# Patient Record
Sex: Female | Born: 2000 | Race: Black or African American | Hispanic: No | Marital: Single | Smoking: Never smoker
Health system: Southern US, Community
[De-identification: ages and names within clinical notes are randomized; demographics above are authoritative.]

## PROBLEM LIST (undated history)

## (undated) DIAGNOSIS — F419 Anxiety disorder, unspecified: Secondary | ICD-10-CM

## (undated) DIAGNOSIS — R011 Cardiac murmur, unspecified: Secondary | ICD-10-CM

## (undated) DIAGNOSIS — E119 Type 2 diabetes mellitus without complications: Secondary | ICD-10-CM

## (undated) HISTORY — PX: ABCESS DRAINAGE: SHX399

## (undated) HISTORY — PX: EYE MUSCLE SURGERY: SHX370

---
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
## 2020-10-30 ENCOUNTER — Other Ambulatory Visit: Payer: Self-pay

## 2020-10-30 ENCOUNTER — Emergency Department (HOSPITAL_COMMUNITY)
Admission: EM | Admit: 2020-10-30 | Discharge: 2020-10-30 | Disposition: A | Discharge status: Home / Self Care | Payer: Medicaid Other | Attending: Emergency Medicine | Admitting: Emergency Medicine

## 2020-10-30 ENCOUNTER — Encounter (HOSPITAL_COMMUNITY): Payer: Self-pay | Admitting: Emergency Medicine

## 2020-10-30 DIAGNOSIS — Z7984 Long term (current) use of oral hypoglycemic drugs: Secondary | ICD-10-CM | POA: Insufficient documentation

## 2020-10-30 DIAGNOSIS — A5901 Trichomonal vulvovaginitis: Secondary | ICD-10-CM | POA: Insufficient documentation

## 2020-10-30 DIAGNOSIS — R21 Rash and other nonspecific skin eruption: Secondary | ICD-10-CM | POA: Insufficient documentation

## 2020-10-30 DIAGNOSIS — R739 Hyperglycemia, unspecified: Secondary | ICD-10-CM

## 2020-10-30 DIAGNOSIS — Z794 Long term (current) use of insulin: Secondary | ICD-10-CM | POA: Diagnosis not present

## 2020-10-30 DIAGNOSIS — E1165 Type 2 diabetes mellitus with hyperglycemia: Secondary | ICD-10-CM | POA: Insufficient documentation

## 2020-10-30 DIAGNOSIS — Z7722 Contact with and (suspected) exposure to environmental tobacco smoke (acute) (chronic): Secondary | ICD-10-CM | POA: Diagnosis not present

## 2020-10-30 LAB — CBC WITH DIFFERENTIAL/PLATELET
Abs Immature Granulocytes: 0.02 10*3/uL (ref 0.00–0.07)
Basophils Absolute: 0 10*3/uL (ref 0.0–0.1)
Basophils Relative: 0 %
Eosinophils Absolute: 0.1 10*3/uL (ref 0.0–0.5)
Eosinophils Relative: 1 %
HCT: 35 % — ABNORMAL LOW (ref 36.0–46.0)
Hemoglobin: 11.3 g/dL — ABNORMAL LOW (ref 12.0–15.0)
Immature Granulocytes: 0 %
Lymphocytes Relative: 38 %
Lymphs Abs: 2.1 10*3/uL (ref 0.7–4.0)
MCH: 27.2 pg (ref 26.0–34.0)
MCHC: 32.3 g/dL (ref 30.0–36.0)
MCV: 84.1 fL (ref 80.0–100.0)
Monocytes Absolute: 0.7 10*3/uL (ref 0.1–1.0)
Monocytes Relative: 12 %
Neutro Abs: 2.7 10*3/uL (ref 1.7–7.7)
Neutrophils Relative %: 49 %
Platelets: 244 10*3/uL (ref 150–400)
RBC: 4.16 MIL/uL (ref 3.87–5.11)
RDW: 13.1 % (ref 11.5–15.5)
WBC: 5.5 10*3/uL (ref 4.0–10.5)
nRBC: 0 % (ref 0.0–0.2)

## 2020-10-30 LAB — I-STAT BETA HCG BLOOD, ED (MC, WL, AP ONLY): I-stat hCG, quantitative: <5 m[IU]/mL (ref <5)

## 2020-10-30 LAB — COMPREHENSIVE METABOLIC PANEL
ALT: 15 U/L (ref 0–44)
AST: 14 U/L — ABNORMAL LOW (ref 15–41)
Albumin: 3.3 g/dL — ABNORMAL LOW (ref 3.5–5.0)
Alkaline Phosphatase: 95 U/L (ref 38–126)
Anion gap: 8 (ref 5–15)
BUN: 7 mg/dL (ref 6–20)
CO2: 22 mmol/L (ref 22–32)
Calcium: 8.7 mg/dL — ABNORMAL LOW (ref 8.9–10.3)
Chloride: 104 mmol/L (ref 98–111)
Creatinine, Ser: 0.51 mg/dL (ref 0.44–1.00)
GFR, Estimated: >60 mL/min (ref >60)
Glucose, Bld: 344 mg/dL — ABNORMAL HIGH (ref 70–99)
Potassium: 3.7 mmol/L (ref 3.5–5.1)
Sodium: 134 mmol/L — ABNORMAL LOW (ref 135–145)
Total Bilirubin: 0.4 mg/dL (ref 0.3–1.2)
Total Protein: 7 g/dL (ref 6.5–8.1)

## 2020-10-30 LAB — CBG MONITORING, ED: Glucose-Capillary: 369 mg/dL — ABNORMAL HIGH (ref 70–99)

## 2020-10-30 LAB — WET PREP, GENITAL
Clue Cells Wet Prep HPF POC: NONE SEEN
Sperm: NONE SEEN
Yeast Wet Prep HPF POC: NONE SEEN

## 2020-10-30 LAB — HIV ANTIBODY (ROUTINE TESTING W REFLEX): HIV Screen 4th Generation wRfx: NONREACTIVE

## 2020-10-30 MED ORDER — PENICILLIN G BENZATHINE 1200000 UNIT/2ML IM SUSY
2.4000 10*6.[IU] | PREFILLED_SYRINGE | Freq: Once | INTRAMUSCULAR | Status: AC
  Administered 2020-10-30: 2.4 10*6.[IU] via INTRAMUSCULAR
  Filled 2020-10-30: qty 4

## 2020-10-30 MED ORDER — METRONIDAZOLE 500 MG PO TABS
500.0000 mg | ORAL_TABLET | Freq: Once | ORAL | Status: AC
  Administered 2020-10-30: 500 mg via ORAL
  Filled 2020-10-30: qty 1

## 2020-10-30 MED ORDER — METRONIDAZOLE 500 MG PO TABS: 500.0000 mg | ORAL_TABLET | Freq: Two times a day (BID) | ORAL | 0 refills | Status: DC

## 2020-10-30 NOTE — Discharge Instructions (Addendum)
Please read the instructions below.  Talk with your primary care provider about any new medications.  Please schedule an appointment for follow up with your OBGYN or primary care.  Finish your antibiotic (Flagyl/Metronidazole) as prescribed. Do not drink alcohol with this medication as it will cause vomiting.  You have been treated with penicillin today. This will treat syphilis, as we discussed your rash is concerning for this.  You will receive a call from the hospital if your test results come back positive. Avoid all sexual activity until you know your test results. If your results come back positive, it is important that you inform all of your sexual partners.  Return to the ER for high fever, severe abdominal pain, or new or worsening symptoms.

## 2020-10-30 NOTE — ED Provider Notes (Signed)
Emergency Medicine Provider Triage Evaluation Note  Danielle Norman , a 20 y.o. female  was evaluated in triage.  Pt complains of rash on hands, started in groin, now spreading. Onset 2 weeks ago.  Review of Systems  Positive: Rash, itching, vaginal dc Negative: Fever  Physical Exam  BP (!) 131/57 (BP Location: Right Arm)   Pulse 86   Temp 99 F (37.2 C) (Oral)   Resp 18   SpO2 100%  Gen:   Awake, no distress   Resp:  Normal effort  MSK:   Moves extremities without difficulty  Other:  Rash to hands visible in triage  Medical Decision Making  Medically screening exam initiated at 12:34 PM.  Appropriate orders placed.  Danielle Norman was informed that the remainder of the evaluation will be completed by another provider, this initial triage assessment does not replace that evaluation, and the importance of remaining in the ED until their evaluation is complete.     Jeannie Fend, PA-C 10/30/20 1235    Tilden Fossa, MD 10/31/20 [phone]

## 2020-10-30 NOTE — ED Provider Notes (Signed)
[facility]   CSN: [id number] Arrival date & time: 10/30/20  1224     History No chief complaint on file.   Danielle Norman is a 20 y.o. female past medical history of type 2 diabetes, presenting for evaluation of rash and vaginal discharge. Patient states rash initially developed into her inner thighs and groin about 2 months ago.  Has been slowly spreading to her hands and feet, mostly on the palms and soles.  She also has rash to her upper abdomen underneath her breasts.  Rash to her hands and feet feel sore.  Initially was more red in color, now is brown.  She states vaginal discharge began around the same time rash started.  Initially thought it was a yeast infection because she frequents that.  Endorses is associated body aches and fatigue.  No fevers.  She states she is sexually active with female partners, has had an occurrence without protection.  No new medications.  No sick contacts.  The history is provided by the patient.       Past Medical History:  Diagnosis Date  . Anxiety   . Diabetes mellitus, type 2 (HCC)    Diagnosed 10/2014, hbA1c 14.1% at diagnosis.  Started on insulin and metformin  . Heart murmur     Patient Active Problem List   Diagnosis Date Noted  . Elevated hemoglobin A1c 04/26/2018  . Peripheral neuropathy 04/26/2018  . Abscess 04/05/2018  . Fever   . Gluteal abscess   . Pilonidal cyst with abscess 04/04/2018  . Severe obesity due to excess calories with serious comorbidity and body mass index (BMI) greater than 99th percentile for age in pediatric patient (HCC) 08/16/2017  . 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 Maladaptive health behaviors affecting medical condition 11/02/2016  . Morbid obesity (HCC) 08/03/2016  . Acanthosis nigricans 08/03/2016  . Non compliance w medication regimen   . Dehydration   . Ketonuria   . Hyperglycemia 10/30/2014  . Type 2 diabetes mellitus with  hyperglycemia, with long-term current use of insulin (HCC) 10/30/2014  . Anxiety     Past Surgical History:  Procedure Laterality Date  . EYE MUSCLE SURGERY       OB History    Gravida  0   Para  0   Term  0   Preterm  0   AB  0   Living  0     SAB  0   IAB  0   Ectopic  0   Multiple  0   Live Births              Family History  Problem Relation Age of Onset  . Kidney disease Father   . Diabetes Father        Reported as type 1 diabetes  . Diabetes Maternal Aunt        type 2 diabetes, was on oral meds, now diet controlled  . Thyroid disease Maternal Grandmother     Social History   Tobacco Use  . Smoking status: Passive Smoke Exposure - Never Smoker  . Smokeless tobacco: Never Used  Vaping Use  . Vaping Use: Former  . Start date: 04/06/2015  . Quit date: 12/04/2017  Substance Use Topics  . Alcohol use: No  . Drug use: Yes    Types: Marijuana    Comment: pt states she smokes once a month    Home Medications Prior to Admission medications  Medication Sig Start Date End Date Taking? Authorizing Provider  metroNIDAZOLE (FLAGYL) 500 MG tablet Take 1 tablet (500 mg total) by mouth 2 (two) times daily. 10/30/20  Yes Roxan Hockey, [place] N, PA-C  Accu-Chek FastClix Lancets MISC Check sugar 6 x daily 10/21/18   Gretchen Short, NP  ACCU-CHEK GUIDE test strip CHECK BLOOD SUGAR UP TO 6 TIMES PER DAY 04/18/18   Gretchen Short, NP  acetaminophen (TYLENOL) 325 MG tablet Take 2 tablets (650 mg total) by mouth every 6 (six) hours as needed for mild pain or moderate pain. 04/06/18   Collene Gobble I, MD  acetone, urine, test strip Check ketones per protocol 11/01/14   Mittie Bodo, MD  glucagon 1 MG injection Use for Severe Hypoglycemia . Inject 1 mg intramuscularly if unresponsive, unable to swallow, unconscious and/or has seizure Patient taking differently: Inject 1 mg into the muscle once as needed. Use for Severe Hypoglycemia . Inject 1 mg intramuscularly  if unresponsive, unable to swallow, unconscious and/or has seizure 11/01/14   Mittie Bodo, MD  insulin aspart (NOVOLOG FLEXPEN) 100 UNIT/ML FlexPen INJECT UP TO 75 UNITS DAILY AS DIRECTED BY MD Patient taking differently: Inject 15-25 Units into the skin 3 (three) times daily with meals. Sliding scale 06/14/17   Gretchen Short, NP  insulin degludec (TRESIBA) 100 UNIT/ML SOPN FlexTouch Pen Inject up to 50 units into the skin daily 09/22/18   Gretchen Short, NP  Insulin Pen Needle (BD PEN NEEDLE NANO U/F) 32G X 4 MM MISC BD PEN NEEDLES- BRAND SPECIFIC. INJECT INSULIN VIA INSULIN PEN 6 X DAILY 10/21/18   Gretchen Short, NP  metFORMIN (GLUCOPHAGE XR) 500 MG 24 hr tablet Take 3 tablets (1,500 mg total) by mouth daily with breakfast. 05/12/17   Gretchen Short, NP  norgestimate-ethinyl estradiol (ORTHO-CYCLEN,SPRINTEC,PREVIFEM) 0.25-35 MG-MCG tablet Take 1 tablet by mouth daily. 02/18/18   Rolm Bookbinder, CNM    Allergies    Patient has no known allergies.  Review of Systems   Review of Systems  Constitutional: Positive for fatigue.  Genitourinary: Positive for vaginal discharge.  Musculoskeletal: Positive for myalgias (Generalized).  Skin: Positive for rash.  All other systems reviewed and are negative.   Physical Exam Updated Vital Signs BP 139/74   Pulse 82   Temp 98.3 F (36.8 C) (Oral)   Resp 18   SpO2 100%   Physical Exam Vitals and nursing note reviewed. Exam conducted with a chaperone present.  Constitutional:      General: She is not in acute distress.    Appearance: She is well-developed. She is not ill-appearing.  HENT:     Head: Normocephalic and atraumatic.  Eyes:     Conjunctiva/sclera: Conjunctivae normal.  Cardiovascular:     Rate and Rhythm: Normal rate and regular rhythm.  Pulmonary:     Effort: Pulmonary effort is normal. No respiratory distress.     Breath sounds: Normal breath sounds.  Abdominal:     General: Bowel sounds are normal.      Palpations: Abdomen is soft.     Tenderness: There is no abdominal tenderness.  Genitourinary:    Cervix: Normal.     Uterus: Normal.      Adnexa: Right adnexa normal and left adnexa normal.     Comments: Pelvic exam performed with female nurse tech chaperone present. Dark red blood present (patient menstruating) Skin:    General: Skin is warm.     Comments: Hands and feet, more commonly to the palms and soles with  hyperpigmented macules with some scaling/dry center.  Similar rash noted to the anterior torso inferior to the breasts, as well as medial proximal thighs  Neurological:     Mental Status: She is alert.  Psychiatric:        Behavior: Behavior normal.         ED Results / Procedures / Treatments   Labs (all labs ordered are listed, but only abnormal results are displayed) Labs Reviewed  WET PREP, GENITAL - Abnormal; Notable for the following components:      Result Value   Trich, Wet Prep PRESENT (*)    WBC, Wet Prep HPF POC MANY (*)    All other components within normal limits  COMPREHENSIVE METABOLIC PANEL - Abnormal; Notable for the following components:   Sodium 134 (*)    Glucose, Bld 344 (*)    Calcium 8.7 (*)    Albumin 3.3 (*)    AST 14 (*)    All other components within normal limits  CBC WITH DIFFERENTIAL/PLATELET - Abnormal; Notable for the following components:   Hemoglobin 11.3 (*)    HCT 35.0 (*)    All other components within normal limits  CBG MONITORING, ED - Abnormal; Notable for the following components:   Glucose-Capillary 369 (*)    All other components within normal limits  HIV ANTIBODY (ROUTINE TESTING W REFLEX)  RPR  I-STAT BETA HCG BLOOD, ED ([facility], [facility], [facility] ONLY)  GC/CHLAMYDIA PROBE AMP ([facility]) NOT AT [facility]    EKG None  Radiology No results found.  Procedures Procedures   Medications Ordered in ED Medications  penicillin g benzathine (BICILLIN LA) 1200000 UNIT/2ML injection 2.4 Million Units (2.4 Million Units  Intramuscular Given 10/30/20 1851)  metroNIDAZOLE (FLAGYL) tablet 500 mg (500 mg Oral Given 10/30/20 1852)    ED Course  I have reviewed the triage vital signs and the nursing notes.  Pertinent labs & imaging results that were available during my care of the patient were reviewed by me and considered in my medical decision making (see chart for details).    MDM Rules/Calculators/A&P                          Patient is a 20 year old sexually active female presenting for 2 months of persisting rash to her hands, feet, torso and medial thighs.  Also complains of vaginal discharge.  She has associated generalized body aches and fatigue, no fever.  Pelvic exam without significant tenderness, she is also menstruating.  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  She has not had any new medications, new insect or tick bites, no exposures to hand-foot-and-mouth (duration of rash also seems extended for normal course).   Wet prep positive for trichomonas.  HIV antibody is nonreactive.  RPR is still pending.  Blood work is unremarkable the exception of hyperglycemia.  Will cover with Bicillin for syphilis.  She is also treated with Flagyl for trichomonas.Pt has been advised to not drink alcohol while on this medication.  Exam not concerning for PID because pt is hemodynamically stable, no cervical motion tenderness on pelvic exam  Patient to be discharged with instructions to follow up with OBGYN. Discussed importance of using protection when sexually active. Pt understands that they have STD cultures pending and that they will need to inform all sexual partners if results return positive.  Pt afebrile and nontoxic, safe for discharge home. Strict return precautions discussed.  Discussed patient with Dr. Denton Lank,  who agrees with workup and care plan.   Final Clinical Impression(s) / ED Diagnoses Final diagnoses:  Hyperglycemia  Rash and nonspecific skin eruption  Trichomonas  vaginitis    Rx / DC Orders ED Discharge Orders         Ordered    metroNIDAZOLE (FLAGYL) 500 MG tablet  2 times daily        10/30/20 1857           Porter Nakama, [place] N, PA-C 10/30/20 1857    Cathren Laine, MD 10/30/20 2251

## 2020-10-30 NOTE — ED Triage Notes (Signed)
Pt here with c/o vaginal discharge and rash to her hands and inner thighs been ongoing for 2 months

## 2020-10-31 LAB — RPR
RPR Ser Ql: REACTIVE — AB
RPR Titer: 1:16 {titer}

## 2020-10-31 LAB — GC/CHLAMYDIA PROBE AMP (~~LOC~~) NOT AT ARMC
Chlamydia: POSITIVE — AB
Comment: NEGATIVE
Comment: NORMAL
Neisseria Gonorrhea: NEGATIVE

## 2020-11-02 LAB — T.PALLIDUM AB, TOTAL: T Pallidum Abs: REACTIVE — AB

## 2021-04-06 ENCOUNTER — Emergency Department (HOSPITAL_COMMUNITY): Payer: Medicaid Other

## 2021-04-06 ENCOUNTER — Emergency Department (HOSPITAL_COMMUNITY)
Admission: EM | Admit: 2021-04-06 | Discharge: 2021-04-06 | Disposition: A | Discharge status: Home / Self Care | Payer: Medicaid Other | Attending: Emergency Medicine | Admitting: Emergency Medicine

## 2021-04-06 ENCOUNTER — Other Ambulatory Visit: Payer: Self-pay

## 2021-04-06 ENCOUNTER — Encounter (HOSPITAL_COMMUNITY): Payer: Self-pay | Admitting: Emergency Medicine

## 2021-04-06 DIAGNOSIS — N9489 Other specified conditions associated with female genital organs and menstrual cycle: Secondary | ICD-10-CM | POA: Diagnosis not present

## 2021-04-06 DIAGNOSIS — E119 Type 2 diabetes mellitus without complications: Secondary | ICD-10-CM | POA: Diagnosis not present

## 2021-04-06 DIAGNOSIS — Z7984 Long term (current) use of oral hypoglycemic drugs: Secondary | ICD-10-CM | POA: Diagnosis not present

## 2021-04-06 DIAGNOSIS — Z794 Long term (current) use of insulin: Secondary | ICD-10-CM | POA: Diagnosis not present

## 2021-04-06 DIAGNOSIS — Z87891 Personal history of nicotine dependence: Secondary | ICD-10-CM | POA: Diagnosis not present

## 2021-04-06 DIAGNOSIS — L0231 Cutaneous abscess of buttock: Secondary | ICD-10-CM | POA: Insufficient documentation

## 2021-04-06 DIAGNOSIS — L0291 Cutaneous abscess, unspecified: Secondary | ICD-10-CM

## 2021-04-06 LAB — CBC WITH DIFFERENTIAL/PLATELET
Abs Immature Granulocytes: 0.04 10*3/uL (ref 0.00–0.07)
Basophils Absolute: 0 10*3/uL (ref 0.0–0.1)
Basophils Relative: 0 %
Eosinophils Absolute: 0 10*3/uL (ref 0.0–0.5)
Eosinophils Relative: 0 %
HCT: 41.3 % (ref 36.0–46.0)
Hemoglobin: 13.5 g/dL (ref 12.0–15.0)
Immature Granulocytes: 0 %
Lymphocytes Relative: 34 %
Lymphs Abs: 3.7 10*3/uL (ref 0.7–4.0)
MCH: 28.1 pg (ref 26.0–34.0)
MCHC: 32.7 g/dL (ref 30.0–36.0)
MCV: 86 fL (ref 80.0–100.0)
Monocytes Absolute: 0.8 10*3/uL (ref 0.1–1.0)
Monocytes Relative: 7 %
Neutro Abs: 6.2 10*3/uL (ref 1.7–7.7)
Neutrophils Relative %: 59 %
Platelets: 340 10*3/uL (ref 150–400)
RBC: 4.8 MIL/uL (ref 3.87–5.11)
RDW: 12.1 % (ref 11.5–15.5)
WBC: 10.8 10*3/uL — ABNORMAL HIGH (ref 4.0–10.5)
nRBC: 0 % (ref 0.0–0.2)

## 2021-04-06 LAB — COMPREHENSIVE METABOLIC PANEL
ALT: 18 U/L (ref 0–44)
AST: 14 U/L — ABNORMAL LOW (ref 15–41)
Albumin: 3.9 g/dL (ref 3.5–5.0)
Alkaline Phosphatase: 126 U/L (ref 38–126)
Anion gap: 8 (ref 5–15)
BUN: 8 mg/dL (ref 6–20)
CO2: 27 mmol/L (ref 22–32)
Calcium: 9.8 mg/dL (ref 8.9–10.3)
Chloride: 98 mmol/L (ref 98–111)
Creatinine, Ser: 0.64 mg/dL (ref 0.44–1.00)
GFR, Estimated: >60 mL/min (ref >60)
Glucose, Bld: 368 mg/dL — ABNORMAL HIGH (ref 70–99)
Potassium: 3.7 mmol/L (ref 3.5–5.1)
Sodium: 133 mmol/L — ABNORMAL LOW (ref 135–145)
Total Bilirubin: 0.4 mg/dL (ref 0.3–1.2)
Total Protein: 8 g/dL (ref 6.5–8.1)

## 2021-04-06 LAB — I-STAT BETA HCG BLOOD, ED (MC, WL, AP ONLY): I-stat hCG, quantitative: <5 m[IU]/mL (ref <5)

## 2021-04-06 MED ORDER — HYDROCODONE-ACETAMINOPHEN 5-325 MG PO TABS: 1.0000 | ORAL_TABLET | Freq: Four times a day (QID) | ORAL | 0 refills | Status: DC | PRN

## 2021-04-06 MED ORDER — LIDOCAINE-EPINEPHRINE (PF) 2 %-1:200000 IJ SOLN
10.0000 mL | Freq: Once | INTRAMUSCULAR | Status: AC
  Administered 2021-04-06: 10 mL
  Filled 2021-04-06: qty 20

## 2021-04-06 MED ORDER — DOXYCYCLINE HYCLATE 100 MG PO CAPS: 100.0000 mg | ORAL_CAPSULE | Freq: Two times a day (BID) | ORAL | 0 refills | Status: AC

## 2021-04-06 MED ORDER — IOHEXOL 350 MG/ML SOLN
75.0000 mL | Freq: Once | INTRAVENOUS | Status: AC | PRN
  Administered 2021-04-06: 75 mL via INTRAVENOUS

## 2021-04-06 MED ORDER — DOXYCYCLINE HYCLATE 100 MG PO TABS
100.0000 mg | ORAL_TABLET | Freq: Once | ORAL | Status: AC
  Administered 2021-04-06: 100 mg via ORAL
  Filled 2021-04-06: qty 1

## 2021-04-06 NOTE — ED Notes (Signed)
DC instructions reviewed with pt. Pt verbalized understanding.  Pt DC 

## 2021-04-06 NOTE — ED Notes (Signed)
Pt transported to CT scanner.

## 2021-04-06 NOTE — ED Provider Notes (Signed)
Emergency Medicine Provider Triage Evaluation Note  Danielle Norman , a 20 y.o. female  was evaluated in triage.  Pt complains of gluteal abscess.  Patient has an abscess to the superior inner right buttock without drainage.  Patient reports she has had an abscess similar to this before that is needed surgical drainage.  Denies any recorded fevers, but mentions chills.  Denies any changes to urinary or bowel habits.  Review of Systems  Positive: Abscess, chills Negative: Fevers, dysuria, hematuria, diarrhea, constipation  Physical Exam  BP (!) 141/82   Pulse 93   Temp 98.3 F (36.8 C) (Oral)   Resp 16   LMP 03/23/2021   SpO2 98%  Gen:   Awake, no distress   Resp:  Normal effort  MSK:   Moves extremities without difficulty  Other:  Small area of induration to the anterior medial right gluteal cleft.  No active drainage.  No erythema or warmth to the area  Medical Decision Making  Medically screening exam initiated at 1:07 PM.  Appropriate orders placed.  LADAWNA WALGREN was informed that the remainder of the evaluation will be completed by another provider, this initial triage assessment does not replace that evaluation, and the importance of remaining in the ED until their evaluation is complete.     Achille Rich, PA-C 04/06/21 1308    Jacalyn Lefevre, MD 04/06/21 1420

## 2021-04-06 NOTE — ED Triage Notes (Addendum)
Pt reports rectal abscess since yesterday with fatigue.  Denies fever and chills.

## 2021-04-06 NOTE — Discharge Instructions (Addendum)
I am prescribing you doxycycline.  This is a strong antibiotic you need to take twice a day for the next 7 days.  Do not stop taking this antibiotic early.  I have prescribed you a strong narcotic called vicodin. Please only take this as prescribed. This medication also has tylenol in it, so please be sure you are not taking more than 3000 mg of tylenol per day. Do not drive or operate heavy machinery after taking this medication. Do not mix it with alcohol.   Please continue to apply warm compresses as well as perform hot soaks.  This will help the abscess drain.  Please have your wound rechecked in 2 days.  If you develop any new or worsening symptoms please come back to the emergency department immediately.  It was a pleasure to meet you.

## 2021-04-06 NOTE — ED Provider Notes (Signed)
[facility] EMERGENCY DEPARTMENT Provider Note   CSN: [id number] Arrival date & time: 04/06/21  1157     History No chief complaint on file.   Danielle Norman is a 20 y.o. female.  HPI Patient is a 20 year old female who presents to the emergency department due to an abscess.  Patient states her symptoms started about 2 days ago and have been progressively worsening.  States her pain and swelling is along the superior inner region of the right buttock.  Patient notes chills and fatigue but denies any fevers, nausea, vomiting, diarrhea.  No abdominal pain.  No drainage.  Patient reports a history of abscess in the region requiring surgical intervention.    Past Medical History:  Diagnosis Date   Anxiety    Diabetes mellitus, type 2 (HCC)    Diagnosed 10/2014, hbA1c 14.1% at diagnosis.  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/2017   Maladaptive health behaviors affecting medical condition 11/02/2016   Morbid obesity (HCC) 08/03/2016   Acanthosis nigricans 08/03/2016   Non compliance w medication regimen    Dehydration    Ketonuria    Hyperglycemia 10/30/2014   Type 2 diabetes mellitus with hyperglycemia, with long-term current use of insulin (HCC) 10/30/2014   Anxiety     Past Surgical History:  Procedure Laterality Date   EYE MUSCLE SURGERY       OB History     Gravida  0   Para  0   Term  0   Preterm  0   AB  0   Living  0      SAB  0   IAB  0   Ectopic  0   Multiple  0   Live Births               Family History  Problem Relation Age of Onset   Kidney disease Father    Diabetes Father        Reported as type 1 diabetes   Diabetes Maternal Aunt        type 2 diabetes, was on oral meds, now diet controlled   Thyroid disease Maternal Grandmother     Social History   Tobacco Use   Smoking status: Passive Smoke Exposure - Never Smoker   Smokeless tobacco: Never  Vaping Use   Vaping Use: Former   Start date: 04/06/2015   Quit date: 12/04/2017  Substance Use Topics   Alcohol use: No   Drug use: Yes    Types: Marijuana    Comment: pt states she smokes once a month    Home Medications Prior to Admission medications   Medication Sig Start Date End Date Taking? Authorizing Provider  doxycycline (VIBRAMYCIN) 100 MG capsule Take 1 capsule (100 mg total) by mouth 2 (two) times daily for 7 days. 04/06/21 04/13/21 Yes Placido Sou, PA-C  HYDROcodone-acetaminophen (NORCO/VICODIN) 5-325 MG tablet Take 1 tablet by mouth every 6 (six) hours as needed. 04/06/21  Yes Placido Sou, PA-C  Accu-Chek FastClix Lancets MISC Check  sugar 6 x daily 10/21/18   Gretchen Short, NP  ACCU-CHEK GUIDE test strip CHECK BLOOD SUGAR UP TO 6 TIMES PER DAY 04/18/18   Gretchen Short, NP  acetone, urine, test strip Check ketones per protocol 11/01/14   Mittie Bodo, MD  glucagon 1 MG injection Use for Severe Hypoglycemia . Inject 1 mg intramuscularly if unresponsive, unable to swallow, unconscious and/or has seizure Patient taking differently: Inject 1 mg into the muscle once as needed. Use for Severe Hypoglycemia . Inject 1 mg intramuscularly if unresponsive, unable to swallow, unconscious and/or has seizure 11/01/14   Mittie Bodo, MD  insulin aspart (NOVOLOG FLEXPEN) 100 UNIT/ML FlexPen INJECT UP TO 75 UNITS DAILY AS DIRECTED BY MD Patient taking differently: Inject 15-25 Units into the skin 3 (three) times daily with meals. Sliding scale 06/14/17   Gretchen Short, NP  insulin  degludec (TRESIBA) 100 UNIT/ML SOPN FlexTouch Pen Inject up to 50 units into the skin daily 09/22/18   Gretchen Short, NP  Insulin Pen Needle (BD PEN NEEDLE NANO U/F) 32G X 4 MM MISC BD PEN NEEDLES- BRAND SPECIFIC. INJECT INSULIN VIA INSULIN PEN 6 X DAILY 10/21/18   Gretchen Short, NP  metFORMIN (GLUCOPHAGE XR) 500 MG 24 hr tablet Take 3 tablets (1,500 mg total) by mouth daily with breakfast. 05/12/17   Gretchen Short, NP  metroNIDAZOLE (FLAGYL) 500 MG tablet Take 1 tablet (500 mg total) by mouth 2 (two) times daily. 10/30/20   Robinson, [place] N, PA-C  norgestimate-ethinyl estradiol (ORTHO-CYCLEN,SPRINTEC,PREVIFEM) 0.25-35 MG-MCG tablet Take 1 tablet by mouth daily. 02/18/18   Rolm Bookbinder, CNM    Allergies    Patient has no known allergies.  Review of Systems   Review of Systems  All other systems reviewed and are negative. Ten systems reviewed and are negative for acute change, except as noted in the HPI.   Physical Exam Updated Vital Signs BP 104/62   Pulse 90   Temp 98.3 F (36.8 C) (Oral)   Resp 16   LMP 03/23/2021   SpO2 96%   Physical Exam Vitals and nursing note reviewed.  Constitutional:      General: She is not in acute distress.    Appearance: Normal appearance. She is not ill-appearing, toxic-appearing or diaphoretic.  HENT:     Head: Normocephalic and atraumatic.     Right Ear: External ear normal.     Left Ear: External ear normal.     Nose: Nose normal.     Mouth/Throat:     Mouth: Mucous membranes are moist.     Pharynx: Oropharynx is clear. No oropharyngeal exudate or posterior oropharyngeal erythema.  Eyes:     General: No scleral icterus.       Right eye: No discharge.        Left eye: No discharge.     Extraocular Movements: Extraocular movements intact.     Conjunctiva/sclera: Conjunctivae normal.  Cardiovascular:     Rate and Rhythm: Normal rate and regular rhythm.     Pulses: Normal pulses.     Heart sounds: Normal heart sounds. No murmur  heard.   No friction rub. No gallop.  Pulmonary:     Effort: Pulmonary effort is normal. No respiratory distress.     Breath sounds: Normal breath sounds. No stridor. No wheezing, rhonchi or rales.  Abdominal:     General: Abdomen is flat.     Palpations: Abdomen is soft.     Tenderness: There is no abdominal tenderness.  Genitourinary:    Comments: Female nursing chaperone present.   3 cm to 4 cm of induration noted to the right superior inner buttock.  Palpable fluctuance noted medial to this.  No overlying skin changes or signs of cellulitis. Musculoskeletal:        General: Normal range of motion.     Cervical back: Normal range of motion and neck supple. No tenderness.  Skin:    General: Skin is warm and dry.  Neurological:     General: No focal deficit present.     Mental Status: She is alert and oriented to person, place, and time.  Psychiatric:        Mood and Affect: Mood normal.        Behavior: Behavior normal.   ED Results / Procedures / Treatments   Labs (all labs ordered are listed, but only abnormal results are displayed) Labs Reviewed  COMPREHENSIVE METABOLIC PANEL - Abnormal; Notable for the following components:      Result Value   Sodium 133 (*)    Glucose, Bld 368 (*)    AST 14 (*)    All other components within normal limits  CBC WITH DIFFERENTIAL/PLATELET - Abnormal; Notable for the following components:   WBC 10.8 (*)    All other components within normal limits  I-STAT BETA HCG BLOOD, ED ([facility], [facility], [facility] ONLY)    EKG None  Radiology CT PELVIS W CONTRAST  Result Date: 04/06/2021 CLINICAL DATA:  Perianal abscess. EXAM: CT PELVIS WITH CONTRAST TECHNIQUE: Multidetector CT imaging of the pelvis was performed using the standard protocol following the bolus administration of intravenous contrast. CONTRAST:  63mL OMNIPAQUE IOHEXOL 350 MG/ML SOLN COMPARISON:  CT pelvis dated 04/04/2018. FINDINGS: Urinary Tract:  No abnormality visualized. Bowel:  Unremarkable  visualized pelvic bowel loops. Vascular/Lymphatic: No pathologically enlarged lymph nodes. No significant vascular abnormality seen. Reproductive:  No mass or other significant abnormality Other: A rim enhancing abscess with surrounding inflammatory changes overlies the coccyx near the midline at the superior aspect of the gluteal cleft. This measures 1.8 (series 5, image 69) x 2.1 x 1.8 cm (series 6, image 123). Inflammatory changes extend inferiorly and superiorly. There is no definite fistulous track connecting this abscess with the anus or rectum. There is no definite extension of inflammatory changes into the pelvis. There is no focal osseous demineralization of the underlying coccyx to suggest osteomyelitis. Musculoskeletal: No suspicious bone lesions identified. IMPRESSION: Rim enhancing abscess near the midline at the superior aspect of the gluteal cleft overlying the coccyx. No definite fistulous connection between this abscess in the anus or rectum. No extension of inflammatory changes into the pelvis. Electronically Signed   By: Romona Curls M.D.   On: 04/06/2021 18:55    Procedures .Marland KitchenIncision and Drainage  Date/Time: 04/06/2021 8:22 PM Performed by: Placido Sou, PA-C Authorized by: Placido Sou, PA-C   Consent:    Consent obtained:  Verbal   Consent given by:  Patient   Risks, benefits, and alternatives were discussed: yes     Risks discussed:  Bleeding, incomplete drainage, pain and infection Universal protocol:    Procedure explained and questions answered to patient or proxy's satisfaction: yes     Relevant documents present and verified: yes     Test results available : yes     Imaging studies available: yes     Required blood products, implants, devices, and special equipment available: yes     Site/side marked: yes     Immediately prior  to procedure, a time out was called: yes     Patient identity confirmed:  Verbally with patient and arm band Location:    Type:   Abscess   Size:  2 cm   Location:  Anogenital   Anogenital location: overyling coccyx. Sedation:    Sedation type:  None Anesthesia:    Anesthesia method:  Local infiltration   Local anesthetic:  Lidocaine 2% WITH epi Procedure type:    Complexity:  Complex Procedure details:    Ultrasound guidance: yes     Needle aspiration: yes     Needle size:  18 G   Incision types:  Stab incision   Wound management:  Probed and deloculated   Drainage:  Bloody and purulent   Drainage amount:  Moderate   Wound treatment:  Wound left open   Packing materials:  None Post-procedure details:    Procedure completion:  Tolerated well, no immediate complications   Medications Ordered in ED Medications  lidocaine-EPINEPHrine (XYLOCAINE W/EPI) 2 %-1:200000 (PF) injection 10 mL (has no administration in time range)  doxycycline (VIBRA-TABS) tablet 100 mg (has no administration in time range)  iohexol (OMNIPAQUE) 350 MG/ML injection 75 mL (75 mLs Intravenous Contrast Given 04/06/21 1837)    ED Course  I have reviewed the triage vital signs and the nursing notes.  Pertinent labs & imaging results that were available during my care of the patient were reviewed by me and considered in my medical decision making (see chart for details).    MDM Rules/Calculators/A&P                          Pt is a 20 y.o. female who presents to the emergency department due to an abscess to the buttock.  Labs: CBC with a white count of 10.8. CMP with a sodium of 133, glucose of 368, AST of 14. I-STAT beta-hCG less than 5.  Imaging: CT scan of the pelvis with contrast shows  IMPRESSION: Rim enhancing abscess near the midline at the superior aspect of the gluteal cleft overlying the coccyx. No definite fistulous connection between this abscess in the anus or rectum. No extension of inflammatory changes into the pelvis.  I, Placido Sou, PA-C, personally reviewed and evaluated these images and lab results as part  of my medical decision-making.  CT scan obtained with findings as noted above.  Region cleaned extensively and abscess was needle aspirated with ultrasound guidance.  This produced 5 cc of purulent drainage.  Region was then incised and deloculated.  Will start patient on a course of doxycycline.  First dose given in the emergency department.  We will also prescribe Norco for breakthrough pain.  We discussed safety regarding this medication.  Patient nontoxic-appearing, afebrile, not tachycardic.  Recommended wound check in 48 to 72 hours.  Discussed return precautions.  Feel that she is stable for discharge at this time and she is agreeable.  Her questions were answered and she was amicable at the time of discharge.  Note: Portions of this report may have been transcribed using voice recognition software. Every effort was made to ensure accuracy; however, inadvertent computerized transcription errors may be present.   Final Clinical Impression(s) / ED Diagnoses Final diagnoses:  Abscess   Rx / DC Orders ED Discharge Orders          Ordered    doxycycline (VIBRAMYCIN) 100 MG capsule  2 times daily        04/06/21 2017  HYDROcodone-acetaminophen (NORCO/VICODIN) 5-325 MG tablet  Every 6 hours PRN        04/06/21 2017             Placido Sou, PA-C 04/06/21 2029    Pollyann Savoy, MD 04/06/21 2244

## 2021-10-20 ENCOUNTER — Emergency Department (HOSPITAL_COMMUNITY)
Admission: EM | Admit: 2021-10-20 | Discharge: 2021-10-20 | Disposition: A | Discharge status: Home / Self Care | Payer: Medicaid Other | Attending: Emergency Medicine | Admitting: Emergency Medicine

## 2021-10-20 ENCOUNTER — Encounter (HOSPITAL_COMMUNITY): Payer: Self-pay | Admitting: Emergency Medicine

## 2021-10-20 DIAGNOSIS — L02412 Cutaneous abscess of left axilla: Secondary | ICD-10-CM | POA: Insufficient documentation

## 2021-10-20 DIAGNOSIS — Z794 Long term (current) use of insulin: Secondary | ICD-10-CM | POA: Diagnosis not present

## 2021-10-20 DIAGNOSIS — N9489 Other specified conditions associated with female genital organs and menstrual cycle: Secondary | ICD-10-CM | POA: Diagnosis not present

## 2021-10-20 DIAGNOSIS — B9689 Other specified bacterial agents as the cause of diseases classified elsewhere: Secondary | ICD-10-CM | POA: Diagnosis not present

## 2021-10-20 DIAGNOSIS — Z7984 Long term (current) use of oral hypoglycemic drugs: Secondary | ICD-10-CM | POA: Insufficient documentation

## 2021-10-20 DIAGNOSIS — E1165 Type 2 diabetes mellitus with hyperglycemia: Secondary | ICD-10-CM

## 2021-10-20 DIAGNOSIS — R21 Rash and other nonspecific skin eruption: Secondary | ICD-10-CM

## 2021-10-20 DIAGNOSIS — E119 Type 2 diabetes mellitus without complications: Secondary | ICD-10-CM | POA: Diagnosis not present

## 2021-10-20 DIAGNOSIS — N760 Acute vaginitis: Secondary | ICD-10-CM | POA: Diagnosis not present

## 2021-10-20 DIAGNOSIS — M7989 Other specified soft tissue disorders: Secondary | ICD-10-CM | POA: Diagnosis present

## 2021-10-20 LAB — I-STAT VENOUS BLOOD GAS, ED
Acid-Base Excess: 1 mmol/L (ref 0.0–2.0)
Bicarbonate: 24 mmol/L (ref 20.0–28.0)
Calcium, Ion: 1.19 mmol/L (ref 1.15–1.40)
HCT: 39 % (ref 36.0–46.0)
Hemoglobin: 13.3 g/dL (ref 12.0–15.0)
O2 Saturation: 92 %
Potassium: 4.1 mmol/L (ref 3.5–5.1)
Sodium: 134 mmol/L — ABNORMAL LOW (ref 135–145)
TCO2: 25 mmol/L (ref 22–32)
pCO2, Ven: 33.3 mmHg — ABNORMAL LOW (ref 44–60)
pH, Ven: 7.466 — ABNORMAL HIGH (ref 7.25–7.43)
pO2, Ven: 60 mmHg — ABNORMAL HIGH (ref 32–45)

## 2021-10-20 LAB — CBC
HCT: 39.2 % (ref 36.0–46.0)
Hemoglobin: 13.1 g/dL (ref 12.0–15.0)
MCH: 28.3 pg (ref 26.0–34.0)
MCHC: 33.4 g/dL (ref 30.0–36.0)
MCV: 84.7 fL (ref 80.0–100.0)
Platelets: 297 10*3/uL (ref 150–400)
RBC: 4.63 MIL/uL (ref 3.87–5.11)
RDW: 12.2 % (ref 11.5–15.5)
WBC: 9.9 10*3/uL (ref 4.0–10.5)
nRBC: 0 % (ref 0.0–0.2)

## 2021-10-20 LAB — URINALYSIS, ROUTINE W REFLEX MICROSCOPIC
Bilirubin Urine: NEGATIVE
Glucose, UA: >=500 mg/dL — AB
Ketones, ur: 20 mg/dL — AB
Nitrite: POSITIVE — AB
Protein, ur: 30 mg/dL — AB
Specific Gravity, Urine: 1.042 — ABNORMAL HIGH (ref 1.005–1.030)
pH: 7 (ref 5.0–8.0)

## 2021-10-20 LAB — BASIC METABOLIC PANEL
Anion gap: 11 (ref 5–15)
BUN: 8 mg/dL (ref 6–20)
CO2: 22 mmol/L (ref 22–32)
Calcium: 9.4 mg/dL (ref 8.9–10.3)
Chloride: 99 mmol/L (ref 98–111)
Creatinine, Ser: 0.5 mg/dL (ref 0.44–1.00)
GFR, Estimated: >60 mL/min (ref >60)
Glucose, Bld: 374 mg/dL — ABNORMAL HIGH (ref 70–99)
Potassium: 4.1 mmol/L (ref 3.5–5.1)
Sodium: 132 mmol/L — ABNORMAL LOW (ref 135–145)

## 2021-10-20 LAB — CBG MONITORING, ED
Glucose-Capillary: 365 mg/dL — ABNORMAL HIGH (ref 70–99)
Glucose-Capillary: 363 mg/dL — ABNORMAL HIGH (ref 70–99)

## 2021-10-20 LAB — RPR
RPR Ser Ql: REACTIVE — AB
RPR Titer: 1:2 {titer}

## 2021-10-20 LAB — POC URINE PREG, ED: Preg Test, Ur: NEGATIVE

## 2021-10-20 LAB — WET PREP, GENITAL
Sperm: NONE SEEN
Trich, Wet Prep: NONE SEEN
WBC, Wet Prep HPF POC: <10 (ref <10)
Yeast Wet Prep HPF POC: NONE SEEN

## 2021-10-20 LAB — HIV ANTIBODY (ROUTINE TESTING W REFLEX): HIV Screen 4th Generation wRfx: NONREACTIVE

## 2021-10-20 MED ORDER — LIVING WELL WITH DIABETES BOOK
Freq: Once | Status: AC
  Filled 2021-10-20: qty 1

## 2021-10-20 MED ORDER — SODIUM CHLORIDE 0.9 % IV BOLUS
1000.0000 mL | Freq: Once | INTRAVENOUS | Status: AC
  Administered 2021-10-20: 1000 mL via INTRAVENOUS

## 2021-10-20 MED ORDER — BD PEN NEEDLE NANO U/F 32G X 4 MM MISC: 1 refills | Status: DC

## 2021-10-20 MED ORDER — METRONIDAZOLE 500 MG PO TABS: 500.0000 mg | ORAL_TABLET | Freq: Two times a day (BID) | ORAL | 0 refills | Status: DC

## 2021-10-20 MED ORDER — LANTUS SOLOSTAR 100 UNIT/ML ~~LOC~~ SOPN: 20.0000 [IU] | PEN_INJECTOR | Freq: Every day | SUBCUTANEOUS | 11 refills | Status: DC

## 2021-10-20 MED ORDER — DOXYCYCLINE HYCLATE 100 MG PO CAPS: 100.0000 mg | ORAL_CAPSULE | Freq: Two times a day (BID) | ORAL | 0 refills | Status: AC

## 2021-10-20 MED ORDER — "PEN NEEDLES 3/16"" 31G X 5 MM MISC": 1.0000 | 1 refills | Status: DC | PRN

## 2021-10-20 MED ORDER — BD PEN NEEDLE NANO U/F 32G X 4 MM MISC: 1 refills | Status: AC

## 2021-10-20 MED ORDER — METFORMIN HCL 1000 MG PO TABS: 500.0000 mg | ORAL_TABLET | Freq: Two times a day (BID) | ORAL | 0 refills | Status: DC

## 2021-10-20 MED ORDER — OXYCODONE-ACETAMINOPHEN 5-325 MG PO TABS
1.0000 | ORAL_TABLET | Freq: Once | ORAL | Status: AC
  Administered 2021-10-20: 1 via ORAL
  Filled 2021-10-20: qty 1

## 2021-10-20 MED ORDER — LIDOCAINE-EPINEPHRINE (PF) 2 %-1:200000 IJ SOLN
20.0000 mL | Freq: Once | INTRAMUSCULAR | Status: AC
  Administered 2021-10-20: 20 mL
  Filled 2021-10-20: qty 20

## 2021-10-20 MED ORDER — BLOOD GLUCOSE METER KIT: PACK | 0 refills | Status: AC

## 2021-10-20 MED ORDER — BLOOD GLUCOSE METER KIT: PACK | 0 refills | Status: DC

## 2021-10-20 MED ORDER — DOXYCYCLINE HYCLATE 100 MG PO CAPS: 100.0000 mg | ORAL_CAPSULE | Freq: Two times a day (BID) | ORAL | 0 refills | Status: DC

## 2021-10-20 MED ORDER — INSULIN STARTER KIT- PEN NEEDLES (ENGLISH)
1.0000 | Freq: Once | Status: AC
Start: 2021-10-20 — End: 2021-10-20
  Administered 2021-10-20: 1
  Filled 2021-10-20: qty 1

## 2021-10-20 NOTE — Discharge Instructions (Addendum)
I reviewed your chart and I am writing you a prescription for a medicine called metformin.  This should help with your elevated blood sugar.  Please follow-up with a primary care provider to manage your blood sugar and continue to care for you and help with your diabetes management. ? ?I am treating you for bacterial vaginosis this is an antibiotic called Flagyl.  ? ?Doxycyline is another antibiotic this antibiotic is for the area of infection around the abscess in your left armpit.  Please take it for 1 week.  Notably, since we are treating you with doxycycline if your swab returns positive for chlamydia you have been treated.  If it comes back positive for gonorrhea you will need to return to the ER for treatment of this.  You also have an HIV and syphilis test pending. ? ?I am writing you a prescription for metformin which will help bring down your blood sugar. Please take 500 mg metformin. ?I have also prescribed you a long-acting insulin I would like you to take 20 units daily in the morning. ? ?Please also take your metformin with this. ?

## 2021-10-20 NOTE — ED Triage Notes (Signed)
Pt reports abscess under left arm for 2 days. Pt has been using warm compresses with some relief. Also reports rash to groin for 2 weeks.  ?

## 2021-10-20 NOTE — ED Provider Triage Note (Signed)
Emergency Medicine Provider Triage Evaluation Note ? ?Danielle Norman , a 21 y.o. female  was evaluated in triage.  Pt complains of rash in groin, abscess in left axilla, also reports DM2 not currently taking medications, unclear what her sugar is recently. Endorses hx of previous abscesses in buttocks. Denies fever, chills, but does endorse some fatigue. ? ?Review of Systems  ?Positive: Abscess, rash ?Negative: Fever, chills ? ?Physical Exam  ?BP (!) 145/77   Pulse 100   Temp 98.3 ?F (36.8 ?C) (Oral)   Resp 16   Ht 5' 11.5" (1.816 m)   SpO2 100%  ?Gen:   Awake, no distress   ?Resp:  Normal effort  ?MSK:   Moves extremities without difficulty  ?Other:  Noted abscess in left axilla around 2x4cm, erythematous, fluctuant ? ?Medical Decision Making  ?Medically screening exam initiated at 10:23 AM.  Appropriate orders placed.  Danielle Norman was informed that the remainder of the evaluation will be completed by another provider, this initial triage assessment does not replace that evaluation, and the importance of remaining in the ED until their evaluation is complete. ? ?Patient will need room for abscess drainage, evaluation of groin rash ?  ?Olene Floss, PA-C ?10/20/21 1025 ? ?

## 2021-10-20 NOTE — ED Provider Notes (Signed)
?[facility] ?Provider Note ? ? ?CSN: [id number] ?Arrival date & time: 10/20/21  0712 ? ?  ? ?History ? ?Chief Complaint  ?Patient presents with  ? Abscess  ? Rash  ? ? ?Danielle Norman is a 21 y.o. female. ? ? ?Abscess ?Rash ? ?Patient is a 21 year old female with past medical history significant for diabetes noncompliant with insulin she has not taken insulin for approximately 1 year.  She states that this is secondary to frustration with her disease and lack of interest in treating it. ? ?She denies any SI, HI, AVH ? ?She also states that she has had a left armpit abscess for the past 2 days states that it has been swollen painful and worse with movement and touch. ? ?She denies any pelvic pain but states that she has had a rash in her groin area. ? ?She states that she did have sexual contact with a new partner a few weeks ago.  States that over the past 2 days she has noticed some abnormal discharge. ? ?  ? ?Home Medications ?Prior to Admission medications   ?Medication Sig Start Date End Date Taking? Authorizing Provider  ?blood glucose meter kit and supplies Dispense based on patient and insurance preference. Use up to four times daily as directed. (FOR ICD-10 E10.9, E11.9). 10/20/21  Yes Tedd Sias, PA  ?doxycycline (VIBRAMYCIN) 100 MG capsule Take 1 capsule (100 mg total) by mouth 2 (two) times daily for 7 days. 10/20/21 10/27/21 Yes Emilyn Ruble, Ova Fresh[place] S, PA  ?insulin glargine (LANTUS SOLOSTAR) 100 UNIT/ML Solostar Pen Inject 20 Units into the skin daily. 10/20/21 04/07/24 Yes Tedd Sias, PA  ?Insulin Pen Needle (PEN NEEDLES 3/16") 31G X 5 MM MISC 1 each by Does not apply route as needed. 10/20/21  Yes Pati Gallo S, PA  ?metFORMIN (GLUCOPHAGE) 1000 MG tablet Take 0.5 tablets (500 mg total) by mouth 2 (two) times daily. 10/20/21  Yes Markesha Hannig, Kathleene Hazel, PA  ?metroNIDAZOLE (FLAGYL) 500 MG tablet Take 1 tablet (500 mg total) by mouth 2 (two) times daily. 10/20/21  Yes Tedd Sias, PA  ?Accu-Chek FastClix Lancets MISC Check sugar 6 x daily 10/21/18   Hermenia Bers, NP  ?ACCU-CHEK GUIDE test strip CHECK BLOOD SUGAR UP TO 6 TIMES PER DAY 04/18/18   Hermenia Bers, NP  ?acetone, urine, test strip Check ketones per protocol 11/01/14   Janell Quiet, MD  ?glucagon 1 MG injection Use for Severe Hypoglycemia . Inject 1 mg intramuscularly if unresponsive, unable to swallow, unconscious and/or has seizure ?Patient taking differently: Inject 1 mg into the muscle once as needed. Use for Severe Hypoglycemia . Inject 1 mg intramuscularly if unresponsive, unable to swallow, unconscious and/or has seizure 11/01/14   Janell Quiet, MD  ?HYDROcodone-acetaminophen (NORCO/VICODIN) 5-325 MG tablet Take 1 tablet by mouth every 6 (six) hours as needed. 04/06/21   Rayna Sexton, PA-C  ?insulin aspart (NOVOLOG FLEXPEN) 100 UNIT/ML FlexPen INJECT UP TO 75 UNITS DAILY AS DIRECTED BY MD ?Patient taking differently: Inject 15-25 Units into the skin 3 (three) times daily with meals. Sliding scale 06/14/17   Hermenia Bers, NP  ?insulin degludec (TRESIBA) 100 UNIT/ML SOPN FlexTouch Pen Inject up to 50 units into the skin daily 09/22/18   Hermenia Bers, NP  ?Insulin Pen Needle (BD PEN NEEDLE NANO U/F) 32G X 4 MM MISC BD PEN NEEDLES- BRAND SPECIFIC. INJECT INSULIN VIA INSULIN PEN 6 X DAILY 10/20/21   Tedd Sias, PA  ?  norgestimate-ethinyl estradiol (ORTHO-CYCLEN,SPRINTEC,PREVIFEM) 0.25-35 MG-MCG tablet Take 1 tablet by mouth daily. 02/18/18   Wende Mott, CNM  ?   ? ?Allergies    ?Patient has no known allergies.   ? ?Review of Systems   ?Review of Systems  ?Skin:  Positive for rash.  ? ?Physical Exam ?Updated Vital Signs ?BP 129/77   Pulse 81   Temp 98.3 ?F (36.8 ?C) (Oral)   Resp 20   Ht 5' 11.5" (1.816 m)   SpO2 100%  ?Physical Exam ?Vitals and nursing note reviewed.  ?Constitutional:   ?   General: She is not in acute distress. ?HENT:  ?   Head: Normocephalic and atraumatic.  ?    Nose: Nose normal.  ?   Mouth/Throat:  ?   Mouth: Mucous membranes are moist.  ?Eyes:  ?   General: No scleral icterus. ?Cardiovascular:  ?   Rate and Rhythm: Normal rate and regular rhythm.  ?   Pulses: Normal pulses.  ?   Heart sounds: Normal heart sounds.  ?Pulmonary:  ?   Effort: Pulmonary effort is normal. No respiratory distress.  ?   Breath sounds: No wheezing.  ?Abdominal:  ?   Palpations: Abdomen is soft.  ?   Tenderness: There is no abdominal tenderness.  ?Musculoskeletal:  ?   Cervical back: Normal range of motion.  ?   Right lower leg: No edema.  ?   Left lower leg: No edema.  ?Skin: ?   General: Skin is warm and dry.  ?   Capillary Refill: Capillary refill takes less than 2 seconds.  ?   Comments: 3x4 fluctuant abscess with some swelling surrounding this area it is tender with slight surrounding erythema. ?It is located in the left axilla.  I do not palpate any notable lymphadenopathy.  ?Neurological:  ?   Mental Status: She is alert. Mental status is at baseline.  ?Psychiatric:     ?   Mood and Affect: Mood normal.     ?   Behavior: Behavior normal.  ? ? ?ED Results / Procedures / Treatments   ?Labs ?(all labs ordered are listed, but only abnormal results are displayed) ?Labs Reviewed  ?WET PREP, GENITAL - Abnormal; Notable for the following components:  ?    Result Value  ? Clue Cells Wet Prep HPF POC PRESENT (*)   ? All other components within normal limits  ?URINALYSIS, ROUTINE W REFLEX MICROSCOPIC - Abnormal; Notable for the following components:  ? APPearance CLOUDY (*)   ? Specific Gravity, Urine 1.042 (*)   ? Glucose, UA >=500 (*)   ? Hgb urine dipstick LARGE (*)   ? Ketones, ur 20 (*)   ? Protein, ur 30 (*)   ? Nitrite POSITIVE (*)   ? Leukocytes,Ua TRACE (*)   ? Bacteria, UA FEW (*)   ? All other components within normal limits  ?BASIC METABOLIC PANEL - Abnormal; Notable for the following components:  ? Sodium 132 (*)   ? Glucose, Bld 374 (*)   ? All other components within normal limits  ?RPR  - Abnormal; Notable for the following components:  ? RPR Ser Ql Reactive (*)   ? All other components within normal limits  ?CBG MONITORING, ED - Abnormal; Notable for the following components:  ? Glucose-Capillary 365 (*)   ? All other components within normal limits  ?I-STAT VENOUS BLOOD GAS, ED - Abnormal; Notable for the following components:  ? pH, Ven 7.466 (*)   ?  pCO2, Ven 33.3 (*)   ? pO2, Ven 60 (*)   ? Sodium 134 (*)   ? All other components within normal limits  ?CBC  ?HIV ANTIBODY (ROUTINE TESTING W REFLEX)  ?BETA-HYDROXYBUTYRIC ACID  ?T.PALLIDUM AB, TOTAL  ?POC URINE PREG, ED  ?CBG MONITORING, ED  ?GC/CHLAMYDIA PROBE AMP ([facility]) NOT AT [facility]  ? ? ?EKG ?None ? ?Radiology ?No results found. ? ?Procedures ?Marland Kitchen.Incision and Drainage ? ?Date/Time: 10/20/2021 4:01 PM ?Performed by: Tedd Sias, PA ?Authorized by: Tedd Sias, PA  ? ?Consent:  ?  Consent obtained:  Verbal ?  Consent given by:  Patient ?  Risks discussed:  Bleeding, incomplete drainage, pain and damage to other organs ?  Alternatives discussed:  No treatment ?Universal protocol:  ?  Procedure explained and questions answered to patient or proxy's satisfaction: yes   ?  Relevant documents present and verified: yes   ?  Test results available : yes   ?  Imaging studies available: yes   ?  Required blood products, implants, devices, and special equipment available: yes   ?  Site/side marked: yes   ?  Immediately prior to procedure, a time out was called: yes   ?  Patient identity confirmed:  Verbally with patient ?Location:  ?  Type:  Abscess ?  Size:  3x4 cm ?  Location: L axilla. ?Pre-procedure details:  ?  Skin preparation:  Betadine ?Anesthesia:  ?  Anesthesia method:  Local infiltration ?  Local anesthetic:  Lidocaine 2% WITH epi ?Procedure type:  ?  Complexity:  Complex ?Procedure details:  ?  Incision types:  Single straight ?  Incision depth:  Subcutaneous ?  Wound management:  Probed and deloculated, irrigated with saline and  extensive cleaning ?  Drainage:  Purulent ?  Drainage amount:  Moderate ?  Packing materials:  1/4 in gauze ?Post-procedure details:  ?  Procedure completion:  Tolerated well, no immediate complications

## 2021-10-20 NOTE — Progress Notes (Addendum)
Inpatient Diabetes Program Recommendations ? ?AACE/ADA: New Consensus Statement on Inpatient Glycemic Control (2015) ? ?Target Ranges:  Prepandial:   less than 140 mg/dL ?     Peak postprandial:   less than 180 mg/dL (1-2 hours) ?     Critically ill patients:  140 - 180 mg/dL  ? ?Lab Results  ?Component Value Date  ? GLUCAP 365 (H) 10/20/2021  ? HGBA1C 11.5 (A) 04/26/2018  ? ? ?Review of Glycemic Control ? ?Diabetes history: DM2 ?Outpatient Diabetes medications: Stopped taking  ? Months ago Tyler Aas 50 units qd, Novolog 15-25 units tid meal coverage, Metformin 1500 mg qd) ?Current orders for Inpatient glycemic control: None yet ? ?Inpatient Diabetes Program Recommendations:   ?Patient is currently in ED. ?Spoke with patient regarding diabetes management. Patient states she stopped taking all diabetes medications including insulin. Patient states she is able to afford medications but doesn't have a reason she stopped her medications. ?Discussed need for followup and patient is willing to do outpatient diabetes education along with starting back on her medications as prescribed. ?Please consider on discharge from ED. ?-Restart basal insulin. Consider Lantus or Tresiba 20-25 units qd ?-Restart Metformin 1500 mg qd. ?Discussed with PA Pati Gallo. ? ?Ordered Living Well With Diabetes and starter kit with pen needles. ? ?Thank you, ?Nani Gasser Bryceson Grape, RN, MSN, CDE  ?Diabetes Coordinator ?Inpatient Glycemic Control Team ?Team Pager [phone] (8am-5pm) ?10/20/2021 2:01 PM ? ? ? ? ?

## 2021-10-21 LAB — T.PALLIDUM AB, TOTAL: T Pallidum Abs: REACTIVE — AB

## 2021-10-21 LAB — GC/CHLAMYDIA PROBE AMP (~~LOC~~) NOT AT ARMC
Chlamydia: NEGATIVE
Comment: NEGATIVE
Comment: NORMAL
Neisseria Gonorrhea: POSITIVE — AB

## 2021-10-22 ENCOUNTER — Encounter (HOSPITAL_COMMUNITY): Payer: Self-pay | Admitting: *Deleted

## 2021-10-22 ENCOUNTER — Emergency Department (HOSPITAL_COMMUNITY)
Admission: EM | Admit: 2021-10-22 | Discharge: 2021-10-22 | Disposition: A | Discharge status: Home / Self Care | Payer: Medicaid Other | Attending: Emergency Medicine | Admitting: Emergency Medicine

## 2021-10-22 ENCOUNTER — Other Ambulatory Visit: Payer: Self-pay

## 2021-10-22 DIAGNOSIS — Z794 Long term (current) use of insulin: Secondary | ICD-10-CM | POA: Insufficient documentation

## 2021-10-22 DIAGNOSIS — E119 Type 2 diabetes mellitus without complications: Secondary | ICD-10-CM | POA: Insufficient documentation

## 2021-10-22 DIAGNOSIS — Z7984 Long term (current) use of oral hypoglycemic drugs: Secondary | ICD-10-CM | POA: Insufficient documentation

## 2021-10-22 DIAGNOSIS — A549 Gonococcal infection, unspecified: Secondary | ICD-10-CM | POA: Diagnosis present

## 2021-10-22 DIAGNOSIS — Z4800 Encounter for change or removal of nonsurgical wound dressing: Secondary | ICD-10-CM | POA: Diagnosis not present

## 2021-10-22 MED ORDER — CEFTRIAXONE SODIUM 500 MG IJ SOLR
500.0000 mg | Freq: Once | INTRAMUSCULAR | Status: AC
  Administered 2021-10-22: 500 mg via INTRAMUSCULAR
  Filled 2021-10-22: qty 500

## 2021-10-22 MED ORDER — STERILE WATER FOR INJECTION IJ SOLN
INTRAMUSCULAR | Status: AC
  Filled 2021-10-22: qty 10

## 2021-10-22 NOTE — ED Provider Triage Note (Signed)
Emergency Medicine Provider Triage Evaluation Note ? ?Dimas Alexandria , a 21 y.o. female  was evaluated in triage.  Pt complains of wound recheck.  Patient had I&D 2 days ago.  Packing came out that same night.  Denies fever, chills.  Patient also had positive gonorrhea which was collected Monday.  This has not been treated yet.  Denies dysuria.  Has some vaginal discharge. ? ?Review of Systems  ?Positive: As above ?Negative: as above ? ?Physical Exam  ?BP 133/81 (BP Location: Right Arm)   Pulse 95   Temp 98.8 ?F (37.1 ?C) (Oral)   Resp 18   LMP 10/15/2021 (Approximate)   SpO2 100%  ?Gen:   Awake, no distress   ?Resp:  Normal effort  ?MSK:   Moves extremities without difficulty ?Other:   ? ?Medical Decision Making  ?Medically screening exam initiated at 2:20 PM.  Appropriate orders placed.  BRINLY MAIETTA was informed that the remainder of the evaluation will be completed by another provider, this initial triage assessment does not replace that evaluation, and the importance of remaining in the ED until their evaluation is complete. ? ? ?  ?Marita Kansas, PA-C ?10/22/21 1421 ? ?

## 2021-10-22 NOTE — Discharge Instructions (Signed)
You were treated in the ER today with an intramuscular injection of antibiotics for your gonorrhea.  Please complete the previously prescribed antibiotics from your last ED visit and follow-up closely with your primary care doctor.  Return to the ER with any new severe symptoms. ?

## 2021-10-22 NOTE — ED Provider Notes (Signed)
?[facility] ?Provider Note ? ? ?CSN: [id number] ?Arrival date & time: 10/22/21  1310 ? ?  ? ?History ? ?Chief Complaint  ?Patient presents with  ? Wound Check  ? ? ?Danielle Norman is a 21 y.o. female who is seen here in the emergency department 48 hours ago with drainage of abscess in the left axilla as well as STI testing.  Patient returns today for treatment of positive gonorrhea, as well as wound check.  States improvement in her left axillary wound with decreasing drainage and pain.  Persistent vaginal discharge at this time. ? ?Patient is endorsing compliance with prescribed Flagyl and doxycycline from previous ED visit.  I personally reviewed her medical records which is history of type 2 diabetes with poor compliance with her medication, obesity, depression, and anxiety.  She is not anticoagulated. ? ?HPI ? ?  ? ?Home Medications ?Prior to Admission medications   ?Medication Sig Start Date End Date Taking? Authorizing Provider  ?Accu-Chek FastClix Lancets MISC Check sugar 6 x daily 10/21/18   Hermenia Bers, NP  ?ACCU-CHEK GUIDE test strip CHECK BLOOD SUGAR UP TO 6 TIMES PER DAY 04/18/18   Hermenia Bers, NP  ?acetone, urine, test strip Check ketones per protocol 11/01/14   Janell Quiet, MD  ?blood glucose meter kit and supplies Dispense based on patient and insurance preference. Use up to four times daily as directed. (FOR ICD-10 E10.9, E11.9). 10/20/21   Tedd Sias, PA  ?doxycycline (VIBRAMYCIN) 100 MG capsule Take 1 capsule (100 mg total) by mouth 2 (two) times daily for 7 days. 10/20/21 10/27/21  Tedd Sias, PA  ?glucagon 1 MG injection Use for Severe Hypoglycemia . Inject 1 mg intramuscularly if unresponsive, unable to swallow, unconscious and/or has seizure ?Patient taking differently: Inject 1 mg into the muscle once as needed. Use for Severe Hypoglycemia . Inject 1 mg intramuscularly if unresponsive, unable to swallow, unconscious and/or has  seizure 11/01/14   Janell Quiet, MD  ?HYDROcodone-acetaminophen (NORCO/VICODIN) 5-325 MG tablet Take 1 tablet by mouth every 6 (six) hours as needed. 04/06/21   Rayna Sexton, PA-C  ?insulin aspart (NOVOLOG FLEXPEN) 100 UNIT/ML FlexPen INJECT UP TO 75 UNITS DAILY AS DIRECTED BY MD ?Patient taking differently: Inject 15-25 Units into the skin 3 (three) times daily with meals. Sliding scale 06/14/17   Hermenia Bers, NP  ?insulin degludec (TRESIBA) 100 UNIT/ML SOPN FlexTouch Pen Inject up to 50 units into the skin daily 09/22/18   Hermenia Bers, NP  ?insulin glargine (LANTUS SOLOSTAR) 100 UNIT/ML Solostar Pen Inject 20 Units into the skin daily. 10/20/21 04/07/24  Tedd Sias, PA  ?Insulin Pen Needle (BD PEN NEEDLE NANO U/F) 32G X 4 MM MISC BD PEN NEEDLES- BRAND SPECIFIC. INJECT INSULIN VIA INSULIN PEN 6 X DAILY 10/20/21   Tedd Sias, PA  ?Insulin Pen Needle (PEN NEEDLES 3/16") 31G X 5 MM MISC 1 each by Does not apply route as needed. 10/20/21   Tedd Sias, PA  ?metFORMIN (GLUCOPHAGE) 1000 MG tablet Take 0.5 tablets (500 mg total) by mouth 2 (two) times daily. 10/20/21   Tedd Sias, PA  ?metroNIDAZOLE (FLAGYL) 500 MG tablet Take 1 tablet (500 mg total) by mouth 2 (two) times daily. 10/20/21   Tedd Sias, PA  ?norgestimate-ethinyl estradiol (ORTHO-CYCLEN,SPRINTEC,PREVIFEM) 0.25-35 MG-MCG tablet Take 1 tablet by mouth daily. 02/18/18   Wende Mott, CNM  ?   ? ?Allergies    ?Patient has no known  allergies.   ? ?Review of Systems   ?Review of Systems  ?Genitourinary:  Positive for vaginal discharge.  ?Skin:  Positive for wound.  ? ?Physical Exam ?Updated Vital Signs ?BP 140/75 (BP Location: Right Arm)   Pulse 79   Temp 98.7 ?F (37.1 ?C) (Oral)   Resp 14   LMP 10/15/2021 (Approximate)   SpO2 99%  ?Physical Exam ?Vitals and nursing note reviewed.  ?Constitutional:   ?   Appearance: She is not ill-appearing or toxic-appearing.  ?HENT:  ?   Head: Normocephalic and atraumatic.  ?    Mouth/Throat:  ?   Mouth: Mucous membranes are moist.  ?   Pharynx: No oropharyngeal exudate or posterior oropharyngeal erythema.  ?Eyes:  ?   General:     ?   Right eye: No discharge.     ?   Left eye: No discharge.  ?   Extraocular Movements: Extraocular movements intact.  ?   Conjunctiva/sclera: Conjunctivae normal.  ?   Pupils: Pupils are equal, round, and reactive to light.  ?Cardiovascular:  ?   Rate and Rhythm: Normal rate and regular rhythm.  ?   Pulses: Normal pulses.  ?Pulmonary:  ?   Effort: Pulmonary effort is normal. No respiratory distress.  ?   Breath sounds: Normal breath sounds. No wheezing or rales.  ?Abdominal:  ?   General: Bowel sounds are normal. There is no distension.  ?   Palpations: Abdomen is soft.  ?   Tenderness: There is no abdominal tenderness. There is no guarding or rebound.  ?Musculoskeletal:     ?   General: No deformity.  ?   Cervical back: Neck supple.  ?Skin: ?   General: Skin is warm and dry.  ?   Capillary Refill: Capillary refill takes less than 2 seconds.  ? ?    ?Neurological:  ?   General: No focal deficit present.  ?   Mental Status: She is alert and oriented to person, place, and time. Mental status is at baseline.  ?Psychiatric:     ?   Mood and Affect: Mood normal.  ? ? ?ED Results / Procedures / Treatments   ?Labs ?(all labs ordered are listed, but only abnormal results are displayed) ?Labs Reviewed - No data to display ? ?EKG ?None ? ?Radiology ?No results found. ? ?Procedures ?Procedures  ? ? ?Medications Ordered in ED ?Medications  ?cefTRIAXone (ROCEPHIN) injection 500 mg (has no administration in time range)  ? ? ?ED Course/ Medical Decision Making/ A&P ?  ?                        ?Medical Decision Making ?21 year old female presents for wound check and treatment for  positive gonorrhea test. ? ?Vital signs are normal and intake.  Cardiopulmonary abdominal exams are benign.  Wound is healing well in the left axilla with apparent improvement in her  cellulitis. ? ? ?Risk ?Prescription drug management. ? ? ?IM Rocephin administered in the emergency department.  Patient instructed to continue her doxycycline and Flagyl as previously prescribed and follow-up with her primary care doctor.  Again reinforced importance of compliance with her diabetic medication. ? ?No further work-up warranted in the ER at this time. Siona voiced understanding of her medical evaluation and treatment plan.  For questions answered to her expressed satisfaction.  Return precautions given.  Patient is well-appearing, stable, and was discharged in good condition. ? ?This chart was dictated using voice recognition software,  Dragon. Despite the best efforts of this provider to proofread and correct errors, errors may still occur which can change documentation meaning. ? ?Final Clinical Impression(s) / ED Diagnoses ?Final diagnoses:  ?Gonorrhea  ? ? ?Rx / DC Orders ?ED Discharge Orders   ? ? None  ? ?  ? ? ?  ?Emeline Darling, PA-C ?10/22/21 1751 ? ?  ?Lianne Cure, DO ?81/77/11 1034 ? ?

## 2021-10-22 NOTE — ED Triage Notes (Signed)
Pt reports having an abscess under left arm that was drained on Monday. Was told to come back here today for wound check. Reports decrease in pain, no drainage, swelling or redness noted. ?

## 2021-12-20 ENCOUNTER — Emergency Department (HOSPITAL_COMMUNITY): Payer: Medicaid Other

## 2021-12-20 ENCOUNTER — Inpatient Hospital Stay (HOSPITAL_COMMUNITY)
Admission: EM | Admit: 2021-12-20 | Discharge: 2021-12-27 | DRG: 485 | Disposition: A | Discharge status: Home Health | Payer: Medicaid Other | Attending: Internal Medicine | Admitting: Internal Medicine

## 2021-12-20 ENCOUNTER — Other Ambulatory Visit: Payer: Self-pay

## 2021-12-20 DIAGNOSIS — F419 Anxiety disorder, unspecified: Secondary | ICD-10-CM | POA: Diagnosis present

## 2021-12-20 DIAGNOSIS — M25462 Effusion, left knee: Secondary | ICD-10-CM

## 2021-12-20 DIAGNOSIS — A5149 Other secondary syphilitic conditions: Secondary | ICD-10-CM | POA: Diagnosis present

## 2021-12-20 DIAGNOSIS — E871 Hypo-osmolality and hyponatremia: Secondary | ICD-10-CM | POA: Diagnosis present

## 2021-12-20 DIAGNOSIS — Y990 Civilian activity done for income or pay: Secondary | ICD-10-CM

## 2021-12-20 DIAGNOSIS — Z833 Family history of diabetes mellitus: Secondary | ICD-10-CM

## 2021-12-20 DIAGNOSIS — M009 Pyogenic arthritis, unspecified: Principal | ICD-10-CM | POA: Diagnosis present

## 2021-12-20 DIAGNOSIS — Z7984 Long term (current) use of oral hypoglycemic drugs: Secondary | ICD-10-CM

## 2021-12-20 DIAGNOSIS — L732 Hidradenitis suppurativa: Secondary | ICD-10-CM | POA: Diagnosis present

## 2021-12-20 DIAGNOSIS — E669 Obesity, unspecified: Secondary | ICD-10-CM | POA: Diagnosis present

## 2021-12-20 DIAGNOSIS — Z793 Long term (current) use of hormonal contraceptives: Secondary | ICD-10-CM

## 2021-12-20 DIAGNOSIS — Z794 Long term (current) use of insulin: Secondary | ICD-10-CM

## 2021-12-20 DIAGNOSIS — E1165 Type 2 diabetes mellitus with hyperglycemia: Secondary | ICD-10-CM

## 2021-12-20 DIAGNOSIS — E1110 Type 2 diabetes mellitus with ketoacidosis without coma: Secondary | ICD-10-CM | POA: Diagnosis present

## 2021-12-20 DIAGNOSIS — Z6829 Body mass index (BMI) 29.0-29.9, adult: Secondary | ICD-10-CM

## 2021-12-20 DIAGNOSIS — D638 Anemia in other chronic diseases classified elsewhere: Secondary | ICD-10-CM | POA: Diagnosis present

## 2021-12-20 DIAGNOSIS — Y92512 Supermarket, store or market as the place of occurrence of the external cause: Secondary | ICD-10-CM

## 2021-12-20 DIAGNOSIS — W2209XA Striking against other stationary object, initial encounter: Secondary | ICD-10-CM | POA: Diagnosis present

## 2021-12-20 DIAGNOSIS — A549 Gonococcal infection, unspecified: Secondary | ICD-10-CM | POA: Diagnosis present

## 2021-12-20 DIAGNOSIS — Z79899 Other long term (current) drug therapy: Secondary | ICD-10-CM

## 2021-12-20 DIAGNOSIS — R011 Cardiac murmur, unspecified: Secondary | ICD-10-CM | POA: Diagnosis present

## 2021-12-20 DIAGNOSIS — E860 Dehydration: Secondary | ICD-10-CM | POA: Diagnosis present

## 2021-12-20 DIAGNOSIS — E876 Hypokalemia: Secondary | ICD-10-CM

## 2021-12-20 DIAGNOSIS — Z8619 Personal history of other infectious and parasitic diseases: Secondary | ICD-10-CM

## 2021-12-20 DIAGNOSIS — M25562 Pain in left knee: Principal | ICD-10-CM

## 2021-12-20 DIAGNOSIS — Z7722 Contact with and (suspected) exposure to environmental tobacco smoke (acute) (chronic): Secondary | ICD-10-CM | POA: Diagnosis present

## 2021-12-20 LAB — CBC WITH DIFFERENTIAL/PLATELET
Abs Immature Granulocytes: 0.05 10*3/uL (ref 0.00–0.07)
Basophils Absolute: 0 10*3/uL (ref 0.0–0.1)
Basophils Relative: 0 %
Eosinophils Absolute: 0 10*3/uL (ref 0.0–0.5)
Eosinophils Relative: 0 %
HCT: 36 % (ref 36.0–46.0)
Hemoglobin: 12.2 g/dL (ref 12.0–15.0)
Immature Granulocytes: 0 %
Lymphocytes Relative: 23 %
Lymphs Abs: 3.3 10*3/uL (ref 0.7–4.0)
MCH: 28 pg (ref 26.0–34.0)
MCHC: 33.9 g/dL (ref 30.0–36.0)
MCV: 82.6 fL (ref 80.0–100.0)
Monocytes Absolute: 1.6 10*3/uL — ABNORMAL HIGH (ref 0.1–1.0)
Monocytes Relative: 12 %
Neutro Abs: 9 10*3/uL — ABNORMAL HIGH (ref 1.7–7.7)
Neutrophils Relative %: 65 %
Platelets: 267 10*3/uL (ref 150–400)
RBC: 4.36 MIL/uL (ref 3.87–5.11)
RDW: 11.9 % (ref 11.5–15.5)
WBC: 13.9 10*3/uL — ABNORMAL HIGH (ref 4.0–10.5)
nRBC: 0 % (ref 0.0–0.2)

## 2021-12-20 MED ORDER — MORPHINE SULFATE (PF) 2 MG/ML IV SOLN
2.0000 mg | Freq: Once | INTRAVENOUS | Status: AC
  Administered 2021-12-20: 2 mg via INTRAVENOUS
  Filled 2021-12-20: qty 1

## 2021-12-20 MED ORDER — MORPHINE SULFATE (PF) 4 MG/ML IV SOLN
4.0000 mg | Freq: Once | INTRAVENOUS | Status: AC
  Administered 2021-12-20: 4 mg via INTRAVENOUS
  Filled 2021-12-20: qty 1

## 2021-12-20 MED ORDER — ONDANSETRON HCL 4 MG/2ML IJ SOLN
4.0000 mg | Freq: Once | INTRAMUSCULAR | Status: AC
  Administered 2021-12-20: 4 mg via INTRAVENOUS
  Filled 2021-12-20: qty 2

## 2021-12-20 MED ORDER — LIDOCAINE-EPINEPHRINE (PF) 2 %-1:200000 IJ SOLN
20.0000 mL | Freq: Once | INTRAMUSCULAR | Status: AC
  Administered 2021-12-21: 20 mL via INTRADERMAL
  Filled 2021-12-20: qty 20

## 2021-12-20 NOTE — ED Triage Notes (Signed)
Pt reports hitting her knee on Thursday with increasing pain and stiffness to same. Reports having to use her hands and wrists to support herself while she walks and now her bilateral wrists hurt and are stiff. Taking OTC meds without relief.

## 2021-12-20 NOTE — ED Provider Notes (Signed)
[facility] EMERGENCY DEPARTMENT Provider Note   CSN: [id number] Arrival date & time: 12/20/21  1547     History {Add pertinent medical, surgical, social history, OB history to HPI:1} Chief Complaint  Patient presents with   Knee Pain    Danielle Norman is a 21 y.o. female.   Knee Pain  Patient is a 21 year old female with a history of obesity, IDDM 2 who presents emergency department for evaluation of left knee pain.  Patient states that this past Thursday evening she was working at [employer] and accidentally hit her left knee against a counter.  She states that she was initially doing okay but over the past several days her knee pain has continued to worsen.  She states that over the past 2 days she has had to use her arms to help her back up when she is in bed secondary to her left knee pain.  She now reports discomfort in her wrist as well.  She has had difficulty ambulating secondary to her ongoing knee pain.  She denies any pain in her ankle or hip.  She denies any numbness or tingling.  She does report some mild swelling around her left knee.  She denies a history of IVDU or any prior history of surgeries to her left knee.     Home Medications Prior to Admission medications   Medication Sig Start Date End Date Taking? Authorizing Provider  Accu-Chek FastClix Lancets MISC Check sugar 6 x daily 10/21/18   Hermenia Bers, NP  ACCU-CHEK GUIDE test strip CHECK BLOOD SUGAR UP TO 6 TIMES PER DAY 04/18/18   Hermenia Bers, NP  acetone, urine, test strip Check ketones per protocol 11/01/14   Janell Quiet, MD  blood glucose meter kit and supplies Dispense based on patient and insurance preference. Use up to four times daily as directed. (FOR ICD-10 E10.9, E11.9). 10/20/21   Tedd Sias, PA  glucagon 1 MG injection Use for Severe Hypoglycemia . Inject 1 mg intramuscularly if unresponsive, unable to swallow, unconscious and/or has seizure Patient taking  differently: Inject 1 mg into the muscle once as needed. Use for Severe Hypoglycemia . Inject 1 mg intramuscularly if unresponsive, unable to swallow, unconscious and/or has seizure 11/01/14   Janell Quiet, MD  HYDROcodone-acetaminophen (NORCO/VICODIN) 5-325 MG tablet Take 1 tablet by mouth every 6 (six) hours as needed. 04/06/21   Rayna Sexton, PA-C  insulin aspart (NOVOLOG FLEXPEN) 100 UNIT/ML FlexPen INJECT UP TO 75 UNITS DAILY AS DIRECTED BY MD Patient taking differently: Inject 15-25 Units into the skin 3 (three) times daily with meals. Sliding scale 06/14/17   Hermenia Bers, NP  insulin degludec (TRESIBA) 100 UNIT/ML SOPN FlexTouch Pen Inject up to 50 units into the skin daily 09/22/18   Hermenia Bers, NP  insulin glargine (LANTUS SOLOSTAR) 100 UNIT/ML Solostar Pen Inject 20 Units into the skin daily. 10/20/21 04/07/24  Tedd Sias, PA  Insulin Pen Needle (BD PEN NEEDLE NANO U/F) 32G X 4 MM MISC BD PEN NEEDLES- BRAND SPECIFIC. INJECT INSULIN VIA INSULIN PEN 6 X DAILY 10/20/21   Pati Gallo S, PA  Insulin Pen Needle (PEN NEEDLES 3/16") 31G X 5 MM MISC 1 each by Does not apply route as needed. 10/20/21   Tedd Sias, PA  metFORMIN (GLUCOPHAGE) 1000 MG tablet Take 0.5 tablets (500 mg total) by mouth 2 (two) times daily. 10/20/21   Tedd Sias, PA  metroNIDAZOLE (FLAGYL) 500 MG tablet Take 1 tablet (  500 mg total) by mouth 2 (two) times daily. 10/20/21   Tedd Sias, PA  norgestimate-ethinyl estradiol (ORTHO-CYCLEN,SPRINTEC,PREVIFEM) 0.25-35 MG-MCG tablet Take 1 tablet by mouth daily. 02/18/18   Wende Mott, CNM      Allergies    Patient has no known allergies.    Review of Systems   Review of Systems  Physical Exam Updated Vital Signs BP 108/84   Pulse (!) 128   Temp 100 F (37.8 C) (Oral)   Resp (!) 22   LMP 12/06/2021 (Approximate)   SpO2 100%  Physical Exam Vitals and nursing note reviewed.  Constitutional:      General: She is not in acute  distress.    Appearance: She is well-developed. She is obese.  HENT:     Head: Normocephalic and atraumatic.     Right Ear: External ear normal.     Left Ear: External ear normal.     Nose: Nose normal.     Mouth/Throat:     Mouth: Mucous membranes are moist.     Pharynx: Oropharynx is clear.  Eyes:     Extraocular Movements: Extraocular movements intact.     Conjunctiva/sclera: Conjunctivae normal.     Pupils: Pupils are equal, round, and reactive to light.  Neck:     Vascular: No carotid bruit.  Cardiovascular:     Rate and Rhythm: Regular rhythm. Tachycardia present.     Heart sounds: No murmur heard. Pulmonary:     Effort: Pulmonary effort is normal. No respiratory distress.     Breath sounds: Normal breath sounds. No stridor. No wheezing, rhonchi or rales.  Abdominal:     Palpations: Abdomen is soft.     Tenderness: There is no abdominal tenderness.  Musculoskeletal:        General: Tenderness (Significant tenderness to to the left knee made worse with palpation and any movement.) present. No swelling.     Cervical back: Neck supple. No tenderness.     Right lower leg: No edema.     Left lower leg: No edema.  Skin:    General: Skin is warm and dry.     Capillary Refill: Capillary refill takes less than 2 seconds.  Neurological:     Mental Status: She is alert.  Psychiatric:        Mood and Affect: Mood normal.     ED Results / Procedures / Treatments   Labs (all labs ordered are listed, but only abnormal results are displayed) Labs Reviewed  SEDIMENTATION RATE  C-REACTIVE PROTEIN  COMPREHENSIVE METABOLIC PANEL  CBC WITH DIFFERENTIAL/PLATELET    EKG None  Radiology DG Knee Complete 4 Views Left  Result Date: 12/20/2021 CLINICAL DATA:  Increasing pain post injury. EXAM: LEFT KNEE - COMPLETE 4+ VIEW COMPARISON:  None Available. FINDINGS: No evidence of fracture, or dislocation. Suprapatellar joint effusion. No significant soft tissue swelling. IMPRESSION: 1.  No fracture or dislocation identified about the left knee. 2. Suprapatellar joint effusion. The presence of suprapatellar joint effusion in a posttraumatic setting suggests radio occult fracture or internal derangement of the knee. Electronically Signed   By: Fidela Salisbury M.D.   On: 12/20/2021 17:09    Procedures Procedures  {Document cardiac monitor, telemetry assessment procedure when appropriate:1}  Medications Ordered in ED Medications  morphine (PF) 4 MG/ML injection 4 mg (has no administration in time range)  ondansetron (ZOFRAN) injection 4 mg (has no administration in time range)  morphine (PF) 2 MG/ML injection 2 mg (2 mg Intravenous  Given 12/20/21 2249)    ED Course/ Medical Decision Making/ A&P                           Medical Decision Making Amount and/or Complexity of Data Reviewed Labs: ordered.  Risk Prescription drug management.   Patient is a 21 year old female presents emergency department as above.  On initial presentation patient is tachycardic at 133 and her blood pressure is 148/96.  Her initial temperature was 100 F.  She is saturating appropriately on room air.  Physical exam did reveal some swelling to her left knee with significant tenderness which seemed out of proportion to the exam findings.  No obvious surrounding erythema was identified.  No obvious puncture sites were identified or bruising from the previously reported injury while at work on Thursday.  Initial suspicion is for muscle strain versus ligament strain versus septic joint.  Baseline laboratory work and imaging studies will be performed.    {Document critical care time when appropriate:1} {Document review of labs and clinical decision tools ie heart score, Chads2Vasc2 etc:1}  {Document your independent review of radiology images, and any outside records:1} {Document your discussion with family members, caretakers, and with consultants:1} {Document social determinants of health affecting  pt's care:1} {Document your decision making why or why not admission, treatments were needed:1} Final Clinical Impression(s) / ED Diagnoses Final diagnoses:  None    Rx / DC Orders ED Discharge Orders     None

## 2021-12-20 NOTE — ED Provider Triage Note (Signed)
Emergency Medicine Provider Triage Evaluation Note  JUMANA PACCIONE , a 21 y.o. female  was evaluated in triage.  Pt complains of knee pain.  Patient fell on her left knee 3 days ago.  She has had progressively worsening swelling pain and difficulty ambulating.  She is also complaining of bilateral forearm pain because she has had to use her arms to hold onto things to get around.  She has taken Aleve without relief.  Patient is tearful during examination, no previous history of knee injuries  Review of Systems  Positive: Knee pain Negative: Unilateral leg swelling  Physical Exam  BP (!) 148/96 (BP Location: Right Arm)   Pulse (!) 133   Temp 100 F (37.8 C) (Oral)   Resp 16   SpO2 95%  Gen:   Awake, no distress   Resp:  Normal effort  MSK:   Moves extremities without difficulty  Other:  Mild swelling  Medical Decision Making  Medically screening exam initiated at 4:08 PM.  Appropriate orders placed.  GERTUDE BENITO was informed that the remainder of the evaluation will be completed by another provider, this initial triage assessment does not replace that evaluation, and the importance of remaining in the ED until their evaluation is complete.  Work-up initiated   Arthor Captain, PA-C 12/20/21 1609

## 2021-12-21 ENCOUNTER — Encounter (HOSPITAL_COMMUNITY): Payer: Self-pay | Admitting: Internal Medicine

## 2021-12-21 ENCOUNTER — Other Ambulatory Visit: Payer: Self-pay

## 2021-12-21 ENCOUNTER — Inpatient Hospital Stay (HOSPITAL_COMMUNITY): Payer: Medicaid Other | Admitting: Certified Registered Nurse Anesthetist

## 2021-12-21 ENCOUNTER — Encounter (HOSPITAL_COMMUNITY)
Admission: EM | Disposition: A | Discharge status: Home Health | Payer: Self-pay | Source: Home / Self Care | Attending: Internal Medicine

## 2021-12-21 DIAGNOSIS — A549 Gonococcal infection, unspecified: Secondary | ICD-10-CM

## 2021-12-21 DIAGNOSIS — Z79899 Other long term (current) drug therapy: Secondary | ICD-10-CM | POA: Diagnosis not present

## 2021-12-21 DIAGNOSIS — M25462 Effusion, left knee: Secondary | ICD-10-CM | POA: Diagnosis present

## 2021-12-21 DIAGNOSIS — A5149 Other secondary syphilitic conditions: Secondary | ICD-10-CM | POA: Diagnosis present

## 2021-12-21 DIAGNOSIS — L732 Hidradenitis suppurativa: Secondary | ICD-10-CM | POA: Diagnosis present

## 2021-12-21 DIAGNOSIS — M009 Pyogenic arthritis, unspecified: Secondary | ICD-10-CM

## 2021-12-21 DIAGNOSIS — E119 Type 2 diabetes mellitus without complications: Secondary | ICD-10-CM | POA: Diagnosis not present

## 2021-12-21 DIAGNOSIS — F419 Anxiety disorder, unspecified: Secondary | ICD-10-CM | POA: Diagnosis present

## 2021-12-21 DIAGNOSIS — Z793 Long term (current) use of hormonal contraceptives: Secondary | ICD-10-CM | POA: Diagnosis not present

## 2021-12-21 DIAGNOSIS — E871 Hypo-osmolality and hyponatremia: Secondary | ICD-10-CM | POA: Diagnosis present

## 2021-12-21 DIAGNOSIS — Z794 Long term (current) use of insulin: Secondary | ICD-10-CM | POA: Diagnosis not present

## 2021-12-21 DIAGNOSIS — Z8619 Personal history of other infectious and parasitic diseases: Secondary | ICD-10-CM | POA: Diagnosis not present

## 2021-12-21 DIAGNOSIS — E1110 Type 2 diabetes mellitus with ketoacidosis without coma: Secondary | ICD-10-CM | POA: Diagnosis present

## 2021-12-21 DIAGNOSIS — W2209XA Striking against other stationary object, initial encounter: Secondary | ICD-10-CM | POA: Diagnosis present

## 2021-12-21 DIAGNOSIS — E669 Obesity, unspecified: Secondary | ICD-10-CM | POA: Diagnosis present

## 2021-12-21 DIAGNOSIS — Z7722 Contact with and (suspected) exposure to environmental tobacco smoke (acute) (chronic): Secondary | ICD-10-CM | POA: Diagnosis present

## 2021-12-21 DIAGNOSIS — D638 Anemia in other chronic diseases classified elsewhere: Secondary | ICD-10-CM | POA: Diagnosis present

## 2021-12-21 DIAGNOSIS — Z6829 Body mass index (BMI) 29.0-29.9, adult: Secondary | ICD-10-CM | POA: Diagnosis not present

## 2021-12-21 DIAGNOSIS — E860 Dehydration: Secondary | ICD-10-CM | POA: Diagnosis present

## 2021-12-21 DIAGNOSIS — Y990 Civilian activity done for income or pay: Secondary | ICD-10-CM | POA: Diagnosis not present

## 2021-12-21 DIAGNOSIS — Z833 Family history of diabetes mellitus: Secondary | ICD-10-CM | POA: Diagnosis not present

## 2021-12-21 DIAGNOSIS — E876 Hypokalemia: Secondary | ICD-10-CM | POA: Diagnosis present

## 2021-12-21 DIAGNOSIS — M00862 Arthritis due to other bacteria, left knee: Secondary | ICD-10-CM | POA: Diagnosis not present

## 2021-12-21 DIAGNOSIS — R011 Cardiac murmur, unspecified: Secondary | ICD-10-CM | POA: Diagnosis present

## 2021-12-21 DIAGNOSIS — Y92512 Supermarket, store or market as the place of occurrence of the external cause: Secondary | ICD-10-CM | POA: Diagnosis not present

## 2021-12-21 DIAGNOSIS — E1165 Type 2 diabetes mellitus with hyperglycemia: Secondary | ICD-10-CM

## 2021-12-21 DIAGNOSIS — Z7984 Long term (current) use of oral hypoglycemic drugs: Secondary | ICD-10-CM | POA: Diagnosis not present

## 2021-12-21 DIAGNOSIS — M25562 Pain in left knee: Secondary | ICD-10-CM | POA: Diagnosis present

## 2021-12-21 HISTORY — PX: I & D EXTREMITY: SHX5045

## 2021-12-21 LAB — CBC
HCT: 34.5 % — ABNORMAL LOW (ref 36.0–46.0)
HCT: 33.4 % — ABNORMAL LOW (ref 36.0–46.0)
Hemoglobin: 11.7 g/dL — ABNORMAL LOW (ref 12.0–15.0)
Hemoglobin: 11.4 g/dL — ABNORMAL LOW (ref 12.0–15.0)
MCH: 28.1 pg (ref 26.0–34.0)
MCH: 28 pg (ref 26.0–34.0)
MCHC: 33.9 g/dL (ref 30.0–36.0)
MCHC: 34.1 g/dL (ref 30.0–36.0)
MCV: 82.7 fL (ref 80.0–100.0)
MCV: 82.1 fL (ref 80.0–100.0)
Platelets: 244 10*3/uL (ref 150–400)
Platelets: 250 10*3/uL (ref 150–400)
RBC: 4.17 MIL/uL (ref 3.87–5.11)
RBC: 4.07 MIL/uL (ref 3.87–5.11)
RDW: 12 % (ref 11.5–15.5)
RDW: 11.9 % (ref 11.5–15.5)
WBC: 13.1 10*3/uL — ABNORMAL HIGH (ref 4.0–10.5)
WBC: 12 10*3/uL — ABNORMAL HIGH (ref 4.0–10.5)
nRBC: 0 % (ref 0.0–0.2)
nRBC: 0 % (ref 0.0–0.2)

## 2021-12-21 LAB — COMPREHENSIVE METABOLIC PANEL
ALT: 15 U/L (ref 0–44)
AST: 12 U/L — ABNORMAL LOW (ref 15–41)
Albumin: 3.2 g/dL — ABNORMAL LOW (ref 3.5–5.0)
Alkaline Phosphatase: 104 U/L (ref 38–126)
Anion gap: 15 (ref 5–15)
BUN: 7 mg/dL (ref 6–20)
CO2: 19 mmol/L — ABNORMAL LOW (ref 22–32)
Calcium: 8.9 mg/dL (ref 8.9–10.3)
Chloride: 93 mmol/L — ABNORMAL LOW (ref 98–111)
Creatinine, Ser: 0.58 mg/dL (ref 0.44–1.00)
GFR, Estimated: >60 mL/min (ref >60)
Glucose, Bld: 353 mg/dL — ABNORMAL HIGH (ref 70–99)
Potassium: 3.5 mmol/L (ref 3.5–5.1)
Sodium: 127 mmol/L — ABNORMAL LOW (ref 135–145)
Total Bilirubin: 0.9 mg/dL (ref 0.3–1.2)
Total Protein: 7.6 g/dL (ref 6.5–8.1)

## 2021-12-21 LAB — SYNOVIAL CELL COUNT + DIFF, W/ CRYSTALS
Crystals, Fluid: NONE SEEN
Eosinophils-Synovial: 0 % (ref 0–1)
Lymphocytes-Synovial Fld: 0 % (ref 0–20)
Monocyte-Macrophage-Synovial Fluid: 1 % — ABNORMAL LOW (ref 50–90)
Neutrophil, Synovial: 99 % — ABNORMAL HIGH (ref 0–25)
WBC, Synovial: 127765 /mm3 — ABNORMAL HIGH (ref 0–200)

## 2021-12-21 LAB — CBG MONITORING, ED
Glucose-Capillary: 326 mg/dL — ABNORMAL HIGH (ref 70–99)
Glucose-Capillary: 376 mg/dL — ABNORMAL HIGH (ref 70–99)

## 2021-12-21 LAB — CREATININE, SERUM
Creatinine, Ser: 0.43 mg/dL — ABNORMAL LOW (ref 0.44–1.00)
GFR, Estimated: >60 mL/min (ref >60)

## 2021-12-21 LAB — GLUCOSE, CAPILLARY
Glucose-Capillary: 287 mg/dL — ABNORMAL HIGH (ref 70–99)
Glucose-Capillary: 228 mg/dL — ABNORMAL HIGH (ref 70–99)
Glucose-Capillary: 140 mg/dL — ABNORMAL HIGH (ref 70–99)
Glucose-Capillary: 312 mg/dL — ABNORMAL HIGH (ref 70–99)
Glucose-Capillary: 208 mg/dL — ABNORMAL HIGH (ref 70–99)

## 2021-12-21 LAB — BASIC METABOLIC PANEL
Anion gap: 13 (ref 5–15)
BUN: 6 mg/dL (ref 6–20)
CO2: 20 mmol/L — ABNORMAL LOW (ref 22–32)
Calcium: 8.5 mg/dL — ABNORMAL LOW (ref 8.9–10.3)
Chloride: 97 mmol/L — ABNORMAL LOW (ref 98–111)
Creatinine, Ser: 0.62 mg/dL (ref 0.44–1.00)
GFR, Estimated: >60 mL/min (ref >60)
Glucose, Bld: 303 mg/dL — ABNORMAL HIGH (ref 70–99)
Potassium: 3.3 mmol/L — ABNORMAL LOW (ref 3.5–5.1)
Sodium: 130 mmol/L — ABNORMAL LOW (ref 135–145)

## 2021-12-21 LAB — C-REACTIVE PROTEIN: CRP: 34.2 mg/dL — ABNORMAL HIGH (ref <1.0)

## 2021-12-21 LAB — PREGNANCY, URINE: Preg Test, Ur: NEGATIVE

## 2021-12-21 LAB — SEDIMENTATION RATE: Sed Rate: 101 mm/hr — ABNORMAL HIGH (ref 0–22)

## 2021-12-21 SURGERY — IRRIGATION AND DEBRIDEMENT EXTREMITY
Anesthesia: General | Site: Knee | Laterality: Left

## 2021-12-21 MED ORDER — KETOROLAC TROMETHAMINE 15 MG/ML IJ SOLN
15.0000 mg | Freq: Four times a day (QID) | INTRAMUSCULAR | Status: DC | PRN
  Administered 2021-12-21 – 2021-12-23 (×6): 15 mg via INTRAVENOUS
  Filled 2021-12-21 (×6): qty 1

## 2021-12-21 MED ORDER — INSULIN ASPART 100 UNIT/ML IJ SOLN
0.0000 [IU] | Freq: Four times a day (QID) | INTRAMUSCULAR | Status: DC
  Administered 2021-12-21: 15 [IU] via SUBCUTANEOUS
  Administered 2021-12-21: 3 [IU] via SUBCUTANEOUS
  Administered 2021-12-22: 11 [IU] via SUBCUTANEOUS
  Administered 2021-12-22 – 2021-12-23 (×3): 7 [IU] via SUBCUTANEOUS

## 2021-12-21 MED ORDER — ONDANSETRON HCL 4 MG/2ML IJ SOLN
INTRAMUSCULAR | Status: DC | PRN
  Administered 2021-12-21: 4 mg via INTRAVENOUS

## 2021-12-21 MED ORDER — POVIDONE-IODINE 10 % EX SWAB
2.0000 | Freq: Once | CUTANEOUS | Status: AC
  Administered 2021-12-21: 2 via TOPICAL

## 2021-12-21 MED ORDER — MORPHINE SULFATE (PF) 2 MG/ML IV SOLN
1.0000 mg | INTRAVENOUS | Status: DC | PRN
  Administered 2021-12-21 – 2021-12-22 (×3): 1 mg via INTRAVENOUS
  Filled 2021-12-21 (×3): qty 1

## 2021-12-21 MED ORDER — ONDANSETRON HCL 4 MG/2ML IJ SOLN: 4.0000 mg | Freq: Four times a day (QID) | INTRAMUSCULAR | Status: DC | PRN

## 2021-12-21 MED ORDER — ONDANSETRON HCL 4 MG PO TABS
4.0000 mg | ORAL_TABLET | Freq: Four times a day (QID) | ORAL | Status: DC | PRN
Start: 2021-12-21 — End: 2021-12-21

## 2021-12-21 MED ORDER — SENNA 8.6 MG PO TABS
1.0000 | ORAL_TABLET | Freq: Two times a day (BID) | ORAL | Status: DC
Start: 2021-12-21 — End: 2021-12-27
  Administered 2021-12-21 – 2021-12-27 (×12): 8.6 mg via ORAL
  Filled 2021-12-21 (×13): qty 1

## 2021-12-21 MED ORDER — VANCOMYCIN HCL 1250 MG/250ML IV SOLN
1250.0000 mg | Freq: Three times a day (TID) | INTRAVENOUS | Status: DC
  Administered 2021-12-21 – 2021-12-23 (×7): 1250 mg via INTRAVENOUS
  Filled 2021-12-21 (×7): qty 250

## 2021-12-21 MED ORDER — DIPHENHYDRAMINE HCL 50 MG/ML IJ SOLN
INTRAMUSCULAR | Status: AC
  Filled 2021-12-21: qty 1

## 2021-12-21 MED ORDER — LACTATED RINGERS IV SOLN: INTRAVENOUS | Status: DC

## 2021-12-21 MED ORDER — CEFAZOLIN SODIUM-DEXTROSE 2-4 GM/100ML-% IV SOLN: 2.0000 g | INTRAVENOUS | Status: DC

## 2021-12-21 MED ORDER — MORPHINE SULFATE (PF) 2 MG/ML IV SOLN
1.0000 mg | Freq: Once | INTRAVENOUS | Status: AC | PRN
  Administered 2021-12-21: 1 mg via INTRAVENOUS
  Filled 2021-12-21: qty 1

## 2021-12-21 MED ORDER — INSULIN GLARGINE-YFGN 100 UNIT/ML ~~LOC~~ SOLN
22.0000 [IU] | Freq: Every day | SUBCUTANEOUS | Status: DC
  Filled 2021-12-21: qty 0.22

## 2021-12-21 MED ORDER — ONDANSETRON HCL 4 MG PO TABS: 4.0000 mg | ORAL_TABLET | Freq: Four times a day (QID) | ORAL | Status: DC | PRN

## 2021-12-21 MED ORDER — HYDROMORPHONE HCL 1 MG/ML IJ SOLN
0.5000 mg | INTRAMUSCULAR | Status: AC | PRN
  Administered 2021-12-21 (×4): 0.5 mg via INTRAVENOUS

## 2021-12-21 MED ORDER — HYDROMORPHONE HCL 2 MG PO TABS
2.0000 mg | ORAL_TABLET | ORAL | Status: DC | PRN
  Administered 2021-12-21 (×3): 2 mg via ORAL
  Filled 2021-12-21 (×3): qty 1

## 2021-12-21 MED ORDER — CEFAZOLIN SODIUM-DEXTROSE 2-4 GM/100ML-% IV SOLN
INTRAVENOUS | Status: AC
  Filled 2021-12-21: qty 100

## 2021-12-21 MED ORDER — INSULIN GLARGINE-YFGN 100 UNIT/ML ~~LOC~~ SOLN
10.0000 [IU] | Freq: Every day | SUBCUTANEOUS | Status: DC
  Administered 2021-12-21: 10 [IU] via SUBCUTANEOUS
  Filled 2021-12-21: qty 0.1

## 2021-12-21 MED ORDER — ACETAMINOPHEN 500 MG PO TABS
ORAL_TABLET | ORAL | Status: AC
  Administered 2021-12-21: 1000 mg via ORAL
  Filled 2021-12-21: qty 2

## 2021-12-21 MED ORDER — SENNOSIDES-DOCUSATE SODIUM 8.6-50 MG PO TABS: 1.0000 | ORAL_TABLET | Freq: Every evening | ORAL | Status: DC | PRN

## 2021-12-21 MED ORDER — ACETAMINOPHEN 500 MG PO TABS: 1000.0000 mg | ORAL_TABLET | Freq: Four times a day (QID) | ORAL | Status: DC | PRN

## 2021-12-21 MED ORDER — POTASSIUM CHLORIDE 10 MEQ/100ML IV SOLN: 10.0000 meq | INTRAVENOUS | Status: DC

## 2021-12-21 MED ORDER — TRANEXAMIC ACID-NACL 1000-0.7 MG/100ML-% IV SOLN: 1000.0000 mg | INTRAVENOUS | Status: DC

## 2021-12-21 MED ORDER — METOCLOPRAMIDE HCL 5 MG PO TABS: 5.0000 mg | ORAL_TABLET | Freq: Three times a day (TID) | ORAL | Status: DC | PRN

## 2021-12-21 MED ORDER — SODIUM CHLORIDE 0.9 % IV SOLN
2.0000 g | Freq: Every day | INTRAVENOUS | Status: DC
  Administered 2021-12-21 – 2021-12-27 (×8): 2 g via INTRAVENOUS
  Filled 2021-12-21 (×9): qty 20

## 2021-12-21 MED ORDER — DEXMEDETOMIDINE (PRECEDEX) IN NS 20 MCG/5ML (4 MCG/ML) IV SYRINGE
PREFILLED_SYRINGE | INTRAVENOUS | Status: DC | PRN
  Administered 2021-12-21 (×2): 8 ug via INTRAVENOUS
  Administered 2021-12-21: 4 ug via INTRAVENOUS
  Administered 2021-12-21: 8 ug via INTRAVENOUS

## 2021-12-21 MED ORDER — INSULIN ASPART 100 UNIT/ML IJ SOLN
0.0000 [IU] | INTRAMUSCULAR | Status: DC | PRN
  Administered 2021-12-21: 8 [IU] via SUBCUTANEOUS

## 2021-12-21 MED ORDER — ORAL CARE MOUTH RINSE: 15.0000 mL | Freq: Once | OROMUCOSAL | Status: AC

## 2021-12-21 MED ORDER — INSULIN GLARGINE-YFGN 100 UNIT/ML ~~LOC~~ SOLN: 10.0000 [IU] | Freq: Every day | SUBCUTANEOUS | Status: DC

## 2021-12-21 MED ORDER — PROMETHAZINE HCL 25 MG/ML IJ SOLN: 6.2500 mg | INTRAMUSCULAR | Status: DC | PRN

## 2021-12-21 MED ORDER — FENTANYL CITRATE (PF) 100 MCG/2ML IJ SOLN
INTRAMUSCULAR | Status: AC
  Filled 2021-12-21: qty 2

## 2021-12-21 MED ORDER — PROPOFOL 10 MG/ML IV BOLUS
INTRAVENOUS | Status: AC
  Filled 2021-12-21: qty 20

## 2021-12-21 MED ORDER — ACETAMINOPHEN 650 MG RE SUPP: 650.0000 mg | Freq: Four times a day (QID) | RECTAL | Status: DC | PRN

## 2021-12-21 MED ORDER — FENTANYL CITRATE (PF) 100 MCG/2ML IJ SOLN
INTRAMUSCULAR | Status: DC | PRN
  Administered 2021-12-21: 25 ug via INTRAVENOUS
  Administered 2021-12-21: 50 ug via INTRAVENOUS
  Administered 2021-12-21 (×2): 25 ug via INTRAVENOUS
  Administered 2021-12-21 (×2): 50 ug via INTRAVENOUS
  Administered 2021-12-21: 25 ug via INTRAVENOUS

## 2021-12-21 MED ORDER — OXYCODONE HCL 5 MG PO TABS: 5.0000 mg | ORAL_TABLET | ORAL | Status: DC | PRN

## 2021-12-21 MED ORDER — METOCLOPRAMIDE HCL 5 MG/ML IJ SOLN: 5.0000 mg | Freq: Three times a day (TID) | INTRAMUSCULAR | Status: DC | PRN

## 2021-12-21 MED ORDER — VANCOMYCIN HCL 2000 MG/400ML IV SOLN
2000.0000 mg | Freq: Once | INTRAVENOUS | Status: AC
  Administered 2021-12-21: 2000 mg via INTRAVENOUS
  Filled 2021-12-21: qty 400

## 2021-12-21 MED ORDER — CHLORHEXIDINE GLUCONATE 0.12 % MT SOLN: 15.0000 mL | Freq: Once | OROMUCOSAL | Status: AC

## 2021-12-21 MED ORDER — KETOROLAC TROMETHAMINE 30 MG/ML IJ SOLN
30.0000 mg | Freq: Once | INTRAMUSCULAR | Status: AC | PRN
  Administered 2021-12-21: 30 mg via INTRAVENOUS

## 2021-12-21 MED ORDER — INSULIN ASPART 100 UNIT/ML IJ SOLN
0.0000 [IU] | Freq: Every day | INTRAMUSCULAR | Status: DC
  Administered 2021-12-21: 2 [IU] via SUBCUTANEOUS
  Administered 2021-12-21: 5 [IU] via SUBCUTANEOUS
  Administered 2021-12-22 – 2021-12-24 (×2): 2 [IU] via SUBCUTANEOUS

## 2021-12-21 MED ORDER — ONDANSETRON HCL 4 MG/2ML IJ SOLN
INTRAMUSCULAR | Status: AC
  Filled 2021-12-21: qty 2

## 2021-12-21 MED ORDER — LIDOCAINE 2% (20 MG/ML) 5 ML SYRINGE
INTRAMUSCULAR | Status: DC | PRN
  Administered 2021-12-21: 60 mg via INTRAVENOUS

## 2021-12-21 MED ORDER — POTASSIUM CHLORIDE CRYS ER 20 MEQ PO TBCR
40.0000 meq | EXTENDED_RELEASE_TABLET | ORAL | Status: AC
  Administered 2021-12-21 (×2): 40 meq via ORAL
  Filled 2021-12-21 (×2): qty 2

## 2021-12-21 MED ORDER — CHLORHEXIDINE GLUCONATE 4 % EX LIQD: 60.0000 mL | Freq: Once | CUTANEOUS | Status: DC

## 2021-12-21 MED ORDER — DIPHENHYDRAMINE HCL 50 MG/ML IJ SOLN
INTRAMUSCULAR | Status: DC | PRN
  Administered 2021-12-21: 12.5 mg via INTRAVENOUS

## 2021-12-21 MED ORDER — ACETAMINOPHEN 500 MG PO TABS: 1000.0000 mg | ORAL_TABLET | Freq: Once | ORAL | Status: AC

## 2021-12-21 MED ORDER — SODIUM CHLORIDE 0.9 % IV SOLN
2.0000 g | Freq: Three times a day (TID) | INTRAVENOUS | Status: DC
  Administered 2021-12-21: 2 g via INTRAVENOUS
  Filled 2021-12-21: qty 12.5

## 2021-12-21 MED ORDER — OXYCODONE HCL 5 MG PO TABS
5.0000 mg | ORAL_TABLET | ORAL | Status: DC | PRN
  Administered 2021-12-21: 5 mg via ORAL
  Filled 2021-12-21: qty 1

## 2021-12-21 MED ORDER — IRRISEPT - 450ML BOTTLE WITH 0.05% CHG IN STERILE WATER, USP 99.95% OPTIME
TOPICAL | Status: DC | PRN
  Administered 2021-12-21: 450 mL

## 2021-12-21 MED ORDER — DEXAMETHASONE SODIUM PHOSPHATE 10 MG/ML IJ SOLN
INTRAMUSCULAR | Status: AC
  Filled 2021-12-21: qty 1

## 2021-12-21 MED ORDER — AMISULPRIDE (ANTIEMETIC) 5 MG/2ML IV SOLN: 10.0000 mg | Freq: Once | INTRAVENOUS | Status: DC | PRN

## 2021-12-21 MED ORDER — MIDAZOLAM HCL 2 MG/2ML IJ SOLN
INTRAMUSCULAR | Status: DC | PRN
  Administered 2021-12-21: 2 mg via INTRAVENOUS

## 2021-12-21 MED ORDER — DOCUSATE SODIUM 100 MG PO CAPS
100.0000 mg | ORAL_CAPSULE | Freq: Two times a day (BID) | ORAL | Status: DC
  Administered 2021-12-21 – 2021-12-27 (×12): 100 mg via ORAL
  Filled 2021-12-21 (×13): qty 1

## 2021-12-21 MED ORDER — FENTANYL CITRATE PF 50 MCG/ML IJ SOSY
50.0000 ug | PREFILLED_SYRINGE | Freq: Once | INTRAMUSCULAR | Status: AC
  Administered 2021-12-21: 50 ug via INTRAVENOUS
  Filled 2021-12-21: qty 1

## 2021-12-21 MED ORDER — CHLORHEXIDINE GLUCONATE 0.12 % MT SOLN
OROMUCOSAL | Status: AC
  Administered 2021-12-21: 15 mL via OROMUCOSAL
  Filled 2021-12-21: qty 15

## 2021-12-21 MED ORDER — OXYCODONE HCL 5 MG PO TABS: 5.0000 mg | ORAL_TABLET | Freq: Once | ORAL | Status: DC | PRN

## 2021-12-21 MED ORDER — ACETAMINOPHEN 325 MG PO TABS
650.0000 mg | ORAL_TABLET | Freq: Four times a day (QID) | ORAL | Status: DC | PRN
  Administered 2021-12-21 – 2021-12-25 (×3): 650 mg via ORAL
  Filled 2021-12-21 (×3): qty 2

## 2021-12-21 MED ORDER — PANTOPRAZOLE SODIUM 40 MG PO TBEC
40.0000 mg | DELAYED_RELEASE_TABLET | Freq: Every day | ORAL | Status: DC
  Administered 2021-12-22 – 2021-12-27 (×6): 40 mg via ORAL
  Filled 2021-12-21 (×6): qty 1

## 2021-12-21 MED ORDER — HYDROMORPHONE HCL 1 MG/ML IJ SOLN
INTRAMUSCULAR | Status: AC
  Filled 2021-12-21: qty 1

## 2021-12-21 MED ORDER — KETOROLAC TROMETHAMINE 30 MG/ML IJ SOLN
INTRAMUSCULAR | Status: AC
  Filled 2021-12-21: qty 1

## 2021-12-21 MED ORDER — FENTANYL CITRATE (PF) 250 MCG/5ML IJ SOLN
INTRAMUSCULAR | Status: AC
  Filled 2021-12-21: qty 5

## 2021-12-21 MED ORDER — INSULIN GLARGINE-YFGN 100 UNIT/ML ~~LOC~~ SOLN
10.0000 [IU] | Freq: Two times a day (BID) | SUBCUTANEOUS | Status: DC
  Administered 2021-12-22: 10 [IU] via SUBCUTANEOUS
  Filled 2021-12-21 (×3): qty 0.1

## 2021-12-21 MED ORDER — SODIUM CHLORIDE 0.9 % IR SOLN
Status: DC | PRN
  Administered 2021-12-21: 3000 mL

## 2021-12-21 MED ORDER — INSULIN ASPART 100 UNIT/ML IJ SOLN
0.0000 [IU] | Freq: Three times a day (TID) | INTRAMUSCULAR | Status: DC
  Administered 2021-12-21: 15 [IU] via SUBCUTANEOUS

## 2021-12-21 MED ORDER — OXYCODONE HCL 5 MG/5ML PO SOLN: 5.0000 mg | Freq: Once | ORAL | Status: DC | PRN

## 2021-12-21 MED ORDER — FENTANYL CITRATE (PF) 100 MCG/2ML IJ SOLN
25.0000 ug | INTRAMUSCULAR | Status: DC | PRN
  Administered 2021-12-21 (×3): 50 ug via INTRAVENOUS

## 2021-12-21 MED ORDER — PROPOFOL 10 MG/ML IV BOLUS
INTRAVENOUS | Status: DC | PRN
  Administered 2021-12-21: 200 mg via INTRAVENOUS

## 2021-12-21 MED ORDER — TRANEXAMIC ACID-NACL 1000-0.7 MG/100ML-% IV SOLN
INTRAVENOUS | Status: AC
  Filled 2021-12-21: qty 100

## 2021-12-21 MED ORDER — 0.9 % SODIUM CHLORIDE (POUR BTL) OPTIME
TOPICAL | Status: DC | PRN
  Administered 2021-12-21: 1000 mL

## 2021-12-21 MED ORDER — SODIUM CHLORIDE 0.9 % IV SOLN
INTRAVENOUS | Status: DC
  Administered 2021-12-21: 1000 mL via INTRAVENOUS

## 2021-12-21 MED ORDER — MIDAZOLAM HCL 2 MG/2ML IJ SOLN
INTRAMUSCULAR | Status: AC
  Filled 2021-12-21: qty 2

## 2021-12-21 MED ORDER — DIPHENHYDRAMINE HCL 50 MG/ML IJ SOLN
12.5000 mg | Freq: Once | INTRAMUSCULAR | Status: AC
  Administered 2021-12-21: 12.5 mg via INTRAVENOUS

## 2021-12-21 MED ORDER — ENOXAPARIN SODIUM 40 MG/0.4ML IJ SOSY
40.0000 mg | PREFILLED_SYRINGE | INTRAMUSCULAR | Status: DC
  Administered 2021-12-22 – 2021-12-27 (×6): 40 mg via SUBCUTANEOUS
  Filled 2021-12-21 (×6): qty 0.4

## 2021-12-21 SURGICAL SUPPLY — 51 items
BAG COUNTER SPONGE SURGICOUNT (BAG) ×2 IMPLANT
BANDAGE ESMARK 6X9 LF (GAUZE/BANDAGES/DRESSINGS) ×1 IMPLANT
BNDG CMPR 9X6 STRL LF SNTH (GAUZE/BANDAGES/DRESSINGS)
BNDG COHESIVE 6X5 TAN ST LF (GAUZE/BANDAGES/DRESSINGS) ×1 IMPLANT
BNDG ELASTIC 6X15 VLCR STRL LF (GAUZE/BANDAGES/DRESSINGS) ×1 IMPLANT
BNDG ESMARK 6X9 LF (GAUZE/BANDAGES/DRESSINGS)
BNDG GAUZE ELAST 4 BULKY (GAUZE/BANDAGES/DRESSINGS) ×1 IMPLANT
CNTNR URN SCR LID CUP LEK RST (MISCELLANEOUS) IMPLANT
CONT SPEC 4OZ STRL OR WHT (MISCELLANEOUS) ×2
COVER SURGICAL LIGHT HANDLE (MISCELLANEOUS) ×2 IMPLANT
CUFF TOURN SGL QUICK 18X4 (TOURNIQUET CUFF) IMPLANT
CUFF TOURN SGL QUICK 34 (TOURNIQUET CUFF)
CUFF TRNQT CYL 34X4.125X (TOURNIQUET CUFF) IMPLANT
DERMABOND ADVANCED (GAUZE/BANDAGES/DRESSINGS) ×1
DERMABOND ADVANCED .7 DNX12 (GAUZE/BANDAGES/DRESSINGS) IMPLANT
DRSG AQUACEL AG ADV 3.5X 6 (GAUZE/BANDAGES/DRESSINGS) ×1 IMPLANT
DRSG PAD ABDOMINAL 8X10 ST (GAUZE/BANDAGES/DRESSINGS) ×1 IMPLANT
DRSG TEGADERM 4X4.75 (GAUZE/BANDAGES/DRESSINGS) ×1 IMPLANT
DURAPREP 26ML APPLICATOR (WOUND CARE) ×1 IMPLANT
EVACUATOR 1/8 PVC DRAIN (DRAIN) ×1 IMPLANT
FACESHIELD WRAPAROUND (MASK) ×2 IMPLANT
FACESHIELD WRAPAROUND OR TEAM (MASK) IMPLANT
GAUZE SPONGE 4X4 12PLY STRL (GAUZE/BANDAGES/DRESSINGS) ×2 IMPLANT
GLOVE BIOGEL M 7.0 STRL (GLOVE) ×2 IMPLANT
GLOVE BIOGEL M STRL SZ7.5 (GLOVE) IMPLANT
GLOVE BIOGEL PI IND STRL 7.5 (GLOVE) ×2 IMPLANT
GLOVE BIOGEL PI IND STRL 8.5 (GLOVE) ×1 IMPLANT
GLOVE BIOGEL PI INDICATOR 7.5 (GLOVE) ×2
GLOVE BIOGEL PI INDICATOR 8.5 (GLOVE) ×1
GLOVE ECLIPSE 8.0 STRL XLNG CF (GLOVE) IMPLANT
GLOVE ORTHO TXT STRL SZ7.5 (GLOVE) ×4 IMPLANT
GLOVE SURG ORTHO 8.5 STRL (GLOVE) ×6 IMPLANT
GOWN STRL REUS W/ TWL LRG LVL3 (GOWN DISPOSABLE) ×1 IMPLANT
GOWN STRL REUS W/ TWL XL LVL3 (GOWN DISPOSABLE) ×3 IMPLANT
GOWN STRL REUS W/TWL LRG LVL3 (GOWN DISPOSABLE) ×2
GOWN STRL REUS W/TWL XL LVL3 (GOWN DISPOSABLE) ×6
HANDPIECE INTERPULSE COAX TIP (DISPOSABLE) ×2
HOOD PEEL AWAY FLYTE STAYCOOL (MISCELLANEOUS) ×1 IMPLANT
KIT BASIN OR (CUSTOM PROCEDURE TRAY) ×2 IMPLANT
MANIFOLD NEPTUNE II (INSTRUMENTS) ×2 IMPLANT
PACK ORTHO EXTREMITY (CUSTOM PROCEDURE TRAY) ×2 IMPLANT
PAD CAST 4YDX4 CTTN HI CHSV (CAST SUPPLIES) ×1 IMPLANT
PADDING CAST COTTON 4X4 STRL (CAST SUPPLIES)
SET HNDPC FAN SPRY TIP SCT (DISPOSABLE) ×1 IMPLANT
SPONGE DRAIN TRACH 4X4 STRL 2S (GAUZE/BANDAGES/DRESSINGS) ×1 IMPLANT
STAPLER VISISTAT 35W (STAPLE) ×1 IMPLANT
STOCKINETTE IMPERVIOUS LG (DRAPES) ×1 IMPLANT
SUT MON AB 2-0 CT1 36 (SUTURE) ×1 IMPLANT
SUT V-LOC 180 0-0 GS22 (SUTURE) ×1 IMPLANT
SYR CONTROL 10ML LL (SYRINGE) ×1 IMPLANT
TOWEL GREEN STERILE (TOWEL DISPOSABLE) ×4 IMPLANT

## 2021-12-21 NOTE — Progress Notes (Signed)
PT Cancellation Note  Patient Details Name: Danielle Norman MRN: [id number] DOB: [birth year]   Cancelled Treatment:    Reason Eval/Treat Not Completed: Patient at procedure or test/unavailable  Patient just now eating lunch. Will attempt to return after she is finished eating.    Jerolyn Center, PT Acute Rehabilitation Services  Office [phone]  Zena Amos 12/21/2021, 2:24 PM

## 2021-12-21 NOTE — Assessment & Plan Note (Signed)
Recent diagnosis of gonorrhea.  Had treatment with ceftriaxone IM on 10/22/21.   Plan to continue with ceftriaxone IV.

## 2021-12-21 NOTE — Anesthesia Preprocedure Evaluation (Addendum)
Anesthesia Evaluation  Patient identified by MRN, date of birth, ID band Patient awake    Reviewed: Allergy & Precautions, NPO status , Patient's Chart, lab work & pertinent test results  Airway Mallampati: II  TM Distance: >3 FB Neck ROM: Full    Dental no notable dental hx.    Pulmonary neg pulmonary ROS, Patient abstained from smoking.,    Pulmonary exam normal        Cardiovascular negative cardio ROS Normal cardiovascular exam     Neuro/Psych PSYCHIATRIC DISORDERS Anxiety Depression  Neuromuscular disease    GI/Hepatic negative GI ROS, (+)     substance abuse  ,   Endo/Other  diabetes, Insulin Dependent, Oral Hypoglycemic Agents  Renal/GU negative Renal ROS     Musculoskeletal  (+) Arthritis ,   Abdominal   Peds  Hematology  (+) Blood dyscrasia, anemia ,   Anesthesia Other Findings SEPTIC ARTHRITIS KNEE  Reproductive/Obstetrics hcg negative                            Anesthesia Physical Anesthesia Plan  ASA: 3  Anesthesia Plan: General   Post-op Pain Management:    Induction: Intravenous  PONV Risk Score and Plan: 3 and Ondansetron, Dexamethasone, Midazolam and Treatment may vary due to age or medical condition  Airway Management Planned: LMA  Additional Equipment:   Intra-op Plan:   Post-operative Plan: Extubation in OR  Informed Consent: I have reviewed the patients History and Physical, chart, labs and discussed the procedure including the risks, benefits and alternatives for the proposed anesthesia with the patient or authorized representative who has indicated his/her understanding and acceptance.     Dental advisory given  Plan Discussed with: CRNA  Anesthesia Plan Comments:        Anesthesia Quick Evaluation

## 2021-12-21 NOTE — Anesthesia Postprocedure Evaluation (Signed)
Anesthesia Post Note  Patient: Danielle Norman  Procedure(s) Performed: IRRIGATION AND DEBRIDEMENT, KNEE (Left: Knee)     Patient location during evaluation: PACU Anesthesia Type: General Level of consciousness: awake Pain management: pain level controlled Vital Signs Assessment: post-procedure vital signs reviewed and stable Respiratory status: spontaneous breathing, nonlabored ventilation, respiratory function stable and patient connected to nasal cannula oxygen Cardiovascular status: blood pressure returned to baseline and stable Postop Assessment: no apparent nausea or vomiting Anesthetic complications: no   No notable events documented.  Last Vitals:  Vitals:   12/21/21 1230 12/21/21 1303  BP: (!) 142/91 140/86  Pulse: (!) 101 (!) 114  Resp: 11 19  Temp:  37.2 C  SpO2: 100% 99%    Last Pain:  Vitals:   12/21/21 1215  TempSrc:   PainSc: Asleep                 Tanaysha Alkins P Illianna Paschal

## 2021-12-21 NOTE — Consult Note (Signed)
ORTHOPAEDIC CONSULTATION  REQUESTING PHYSICIAN: Arrien, Jimmy Picket,*  PCP:  Angeline Slim, MD  Chief Complaint: Left knee pain and swelling  HPI: Danielle Norman is a 21 y.o. female who complains of a couple days of left knee pain and swelling that has been getting worse.  She came to the emergency department late last night.  She was found to have an effusion.  She endorsed subjective fevers.The left knee was aspirated in the ER yielding 127,765 white blood cells with 99% neutrophils.  Gram stain negative, culture pending.  Serum WBC 13.1, sed rate 101 mm/h, C-reactive protein 342 mg/L.  She was then given IV vancomycin and cefepime after the aspiration was performed.  She was admitted to the hospitalist.  Of note, she was treated for gonorrhea 2 months ago.  Past Medical History:  Diagnosis Date   Anxiety    Diabetes mellitus, type 2 ([place])    Diagnosed 10/2014, hbA1c 14.1% at diagnosis.  Started on insulin and metformin   Heart murmur    Past Surgical History:  Procedure Laterality Date   ABCESS DRAINAGE     buttucks   EYE MUSCLE SURGERY     Social History   Socioeconomic History   Marital status: Single    Spouse name: Not on file   Number of children: Not on file   Years of education: Not on file   Highest education level: Not on file  Occupational History   Not on file  Tobacco Use   Smoking status: Never    Passive exposure: Yes   Smokeless tobacco: Never  Vaping Use   Vaping Use: Former   Start date: 04/06/2015   Quit date: 12/04/2017  Substance and Sexual Activity   Alcohol use: No   Drug use: Yes    Types: Marijuana    Comment: pt states she smokes once a month   Sexual activity: Yes    Birth control/protection: Implant    Comment: Nexplanon  Other Topics Concern   Not on file  Social History Narrative   [employer]   Social Determinants of Health   Financial Resource Strain: Not on file  Food Insecurity: Not on file   Transportation Needs: Not on file  Physical Activity: Not on file  Stress: Not on file  Social Connections: Not on file   Family History  Problem Relation Age of Onset   Kidney disease Father    Diabetes Father        Reported as type 1 diabetes   Diabetes Maternal Aunt        type 2 diabetes, was on oral meds, now diet controlled   Thyroid disease Maternal Grandmother    No Known Allergies Prior to Admission medications   Medication Sig Start Date End Date Taking? Authorizing Provider  Etonogestrel (NEXPLANON ) Inject into the skin.   Yes [provider]  insulin degludec (TRESIBA) 100 UNIT/ML SOPN FlexTouch Pen Inject up to 50 units into the skin daily Patient taking differently: Inject 22 Units into the skin daily. 09/22/18  Yes Hermenia Bers, NP  metFORMIN (GLUCOPHAGE) 1000 MG tablet Take 0.5 tablets (500 mg total) by mouth 2 (two) times daily. 10/20/21  Yes Fondaw, Wylder S, PA  naproxen sodium (ALEVE) 220 MG tablet Take 440 mg by mouth 2 (two) times daily as needed (pain).   Yes [provider]  Accu-Chek FastClix Lancets MISC Check sugar 6 x daily 10/21/18   Hermenia Bers, NP  ACCU-CHEK GUIDE test  strip CHECK BLOOD SUGAR UP TO 6 TIMES PER DAY 04/18/18   Hermenia Bers, NP  acetone, urine, test strip Check ketones per protocol 11/01/14   Janell Quiet, MD  blood glucose meter kit and supplies Dispense based on patient and insurance preference. Use up to four times daily as directed. (FOR ICD-10 E10.9, E11.9). 10/20/21   Tedd Sias, PA  glucagon 1 MG injection Use for Severe Hypoglycemia . Inject 1 mg intramuscularly if unresponsive, unable to swallow, unconscious and/or has seizure Patient not taking: Reported on 12/21/2021 11/01/14   Janell Quiet, MD  insulin aspart (NOVOLOG FLEXPEN) 100 UNIT/ML FlexPen INJECT UP TO 75 UNITS DAILY AS DIRECTED BY MD Patient not taking: Reported on 12/21/2021 06/14/17   Hermenia Bers, NP  insulin glargine  (LANTUS SOLOSTAR) 100 UNIT/ML Solostar Pen Inject 20 Units into the skin daily. Patient not taking: Reported on 12/21/2021 10/20/21 04/07/24  Tedd Sias, PA  Insulin Pen Needle (BD PEN NEEDLE NANO U/F) 32G X 4 MM MISC BD PEN NEEDLES- BRAND SPECIFIC. INJECT INSULIN VIA INSULIN PEN 6 X DAILY 10/20/21   Pati Gallo S, PA  Insulin Pen Needle (PEN NEEDLES 3/16") 31G X 5 MM MISC 1 each by Does not apply route as needed. 10/20/21   Tedd Sias, PA  metroNIDAZOLE (FLAGYL) 500 MG tablet Take 1 tablet (500 mg total) by mouth 2 (two) times daily. Patient not taking: Reported on 12/21/2021 10/20/21   Tedd Sias, PA  norgestimate-ethinyl estradiol (ORTHO-CYCLEN,SPRINTEC,PREVIFEM) 0.25-35 MG-MCG tablet Take 1 tablet by mouth daily. Patient not taking: Reported on 12/21/2021 02/18/18   Wende Mott, CNM   CT Knee Left Wo Contrast  Result Date: 12/20/2021 CLINICAL DATA:  Knee pain, stress fracture suspected. EXAM: CT OF THE LEFT KNEE WITHOUT CONTRAST TECHNIQUE: Multidetector CT imaging of the left knee was performed according to the standard protocol. Multiplanar CT image reconstructions were also generated. RADIATION DOSE REDUCTION: This exam was performed according to the departmental dose-optimization program which includes automated exposure control, adjustment of the mA and/or kV according to patient size and/or use of iterative reconstruction technique. COMPARISON:  None Available. FINDINGS: Bones/Joint/Cartilage No acute fracture or dislocation. Joint spaces maintained. There is a moderate joint effusion Ligaments Suboptimally assessed by CT. Muscles and Tendons No intramuscular edema or hematoma. The quadriceps and patellar tendons are intact. Soft tissues Mild subcutaneous fat stranding is noted anterior to the patella. IMPRESSION: 1. No acute fracture or dislocation. If there is clinical concern for stress fracture or internal derangement, MRI may be beneficial for further evaluation. 2. Moderate joint  effusion. Electronically Signed   By: Brett Fairy M.D.   On: 12/20/2021 23:30   DG Knee Complete 4 Views Left  Result Date: 12/20/2021 CLINICAL DATA:  Increasing pain post injury. EXAM: LEFT KNEE - COMPLETE 4+ VIEW COMPARISON:  None Available. FINDINGS: No evidence of fracture, or dislocation. Suprapatellar joint effusion. No significant soft tissue swelling. IMPRESSION: 1. No fracture or dislocation identified about the left knee. 2. Suprapatellar joint effusion. The presence of suprapatellar joint effusion in a posttraumatic setting suggests radio occult fracture or internal derangement of the knee. Electronically Signed   By: Fidela Salisbury M.D.   On: 12/20/2021 17:09    Positive ROS: All other systems have been reviewed and were otherwise negative with the exception of those mentioned in the HPI and as above.  Physical Exam: General: Alert, no acute distress Cardiovascular: No pedal edema Respiratory: No cyanosis, no use of accessory musculature  GI: No organomegaly, abdomen is soft and non-tender Skin: No lesions in the area of chief complaint Neurologic: Sensation intact distally Psychiatric: Patient is competent for consent with normal mood and affect Lymphatic: No axillary or cervical lymphadenopathy  MUSCULOSKELETAL: Examination of the left knee reveals warmth and an effusion.  Limited range of motion secondary to pain.  She is neurovascularly intact distally.  Assessment: Native septic arthritis left knee. Insulin-dependent type 2 diabetes.  Plan: I discussed the findings with the patient and her parents.  Inflammatory markers and left knee synovial fluid aspiration are diagnostic for native septic arthritis.  I recommend operative debridement.  Plan for surgery today.  Continue NPO.  Once her cultures come back, she will need PICC line and infectious disease consult for IV antibiotic selection and duration.  All questions were solicited and answered.    Bertram Savin,  MD [phone]    12/21/2021 10:03 AM

## 2021-12-21 NOTE — ED Provider Notes (Signed)
.  Joint Aspiration/Arthrocentesis  Date/Time: 12/21/2021 12:21 AM  Performed by: Glendora Score, MD Authorized by: Glendora Score, MD   Consent:    Consent obtained:  Verbal   Consent given by:  Patient   Risks discussed:  Bleeding, infection, pain, incomplete drainage, nerve damage and poor cosmetic result   Alternatives discussed:  No treatment, delayed treatment and observation Location:    Location:  Knee   Knee:  L knee Anesthesia:    Anesthesia method:  Local infiltration   Local anesthetic:  Lidocaine 2% WITH epi Procedure details:    Preparation: Patient was prepped and draped in usual sterile fashion     Needle gauge:  20 G   Ultrasound guidance: no     Approach:  Medial   Aspirate amount:  5 cc   Aspirate characteristics:  Purulent and yellow   Steroid injected: no   Post-procedure details:    Procedure completion:  Tolerated well, no immediate complications     Glendora Score, MD 12/21/21 7062

## 2021-12-21 NOTE — Anesthesia Procedure Notes (Signed)
Procedure Name: LMA Insertion Date/Time: 12/21/2021 10:18 AM  Performed by: Epifanio Lesches, CRNAPre-anesthesia Checklist: Patient identified, Emergency Drugs available, Suction available, Timeout performed and Patient being monitored Patient Re-evaluated:Patient Re-evaluated prior to induction Oxygen Delivery Method: Circle system utilized Preoxygenation: Pre-oxygenation with 100% oxygen Induction Type: IV induction Ventilation: Mask ventilation without difficulty LMA: LMA inserted LMA Size: 4.0 Tube type: Oral Placement Confirmation: positive ETCO2, CO2 detector and breath sounds checked- equal and bilateral Tube secured with: Tape Dental Injury: Teeth and Oropharynx as per pre-operative assessment

## 2021-12-21 NOTE — Op Note (Signed)
OPERATIVE REPORT   12/21/2021  11:15 AM  PATIENT:  Danielle Norman   SURGEON:  Jonette Pesa, MD  ASSISTANT: Staff.   PREOPERATIVE DIAGNOSIS: Native septic arthritis left knee  POSTOPERATIVE DIAGNOSIS:  Same.  PROCEDURE: Open arthrotomy and drainage left knee.  ANESTHESIA:   GETA.  ANTIBIOTICS: Patient on scheduled cefepime and vancomycin.  ESTIMATED BLOOD LOSS: 25 cc.  TUBES AND DRAINS: Medium Hemovac.  SPECIMENS: Left knee synovium for aerobic, anaerobic, and gonococcus culture.  COMPLICATIONS: None.  DISPOSITION: Stable to PACU.  SURGICAL INDICATIONS:  Danielle Norman is a 21 y.o. female with a diagnosis of native septic arthritis of the left knee.  She was indicated for operative debridement.  The risks, benefits, and alternatives were discussed with the patient preoperatively including but not limited to the risks of infection malunion, nonunion, cardiopulmonary complications, the need for repeat surgery, among others, and the patient was willing to proceed.  PROCEDURE IN DETAIL: The patient was identified in the holding area using 2 identifiers.  The surgical site was marked by myself.  She was taken to the operating room, and placed supine on the operating room table.  General anesthesia was induced.  All bony prominences were well-padded.  The left lower extremity was prepped and draped in the normal sterile surgical fashion.  Timeout was called, verifying site and site of surgery.  The patient is already receiving scheduled IV antibiotics.  A tourniquet was not utilized.  4 cm longitudinal incision was created over the superior aspect of the patella.  Full-thickness skin flaps were raised.  A limited medial parapatellar arthrotomy was created, taking care to protect underlying cartilage.  Upon entering the joint, there was a copious amount of grossly purulent fluid.  Cartilage in the patellofemoral joint appeared healthy.  The synovium was not chronically  inflamed.  I sent a representative sample of synovial tissue for aerobic, anaerobic, and gonococcus culture.  The knee was copiously irrigated with 3 L of saline using a pulse lavage.  Hemostasis was achieved with Bovie electrocautery.  I inserted a medium Hemovac drain into the lateral gutter.  The arthrotomy was closed with #0 V-Loc suture.  Deep dermal tissue was reapproximated with 2-0 Monocryl.  Skin was reapproximated with staples.  Dermabond was applied to the skin.  Once the glue was fully dried, an Aquacel dressing was placed.  Compressive dressing was applied.  The patient was awakened from general anesthesia, and transferred to the PACU in stable condition.  Sponge, needle, and instrument counts were correct at the end of the case x2.  There were no known complications.  POSTOPERATIVE PLAN: Postoperatively, follow synovial fluid culture from the ER and tissue culture from the operating room.  Continue broad-spectrum IV antibiotics for now, and tailor when appropriate.  She will need a PICC line and infectious disease consult for IV antibiotic selection and duration.  We will discontinue the Hemovac drain when there is less than 30 cc of output per shift.

## 2021-12-21 NOTE — Assessment & Plan Note (Addendum)
Patient with mild ketosis on admission, follow up anion gap is down to 13, mild DKA Glucose is 303 mg/dl Patient currently is NPO for surgical procedure.  At home patient on 20 units of basal insulin.  Plan to continue with basal insulin 10 units for now, patient is NPO and will need close capillary glucose monitoring. If worsening hyperglycemia patient will need insulin drip for tight glucose monitoring and prevent worsening ketoacidosis.

## 2021-12-21 NOTE — ED Notes (Signed)
PT c/o of how this RN hurt her hand while disconnecting abx from hub. Pt upset and requesting to see MD. This RN apologized and reaffirmed that this RN was not intentionally trying to hurt the patient's hand while disconnecting. PT stated that they no longer needed to see MD about this issue.

## 2021-12-21 NOTE — Evaluation (Signed)
Physical Therapy Evaluation Patient Details Name: Danielle Norman MRN: [id number] DOB: [birth year] Today's Date: 12/21/2021  History of Present Illness  21 y.o. female who presented to the ED 12/20/21 for evaluation of left knee pain. Left knee x-ray showed suprapatellar joint effusion.  No fracture or dislocation identified. +septic arthritis; +aspiration; 7/9 Open arthrotomy and drainage left knee.  PMH significant for insulin-dependent type 2 diabetes, hidradenitis suppurativa  Clinical Impression   Patient is s/p above surgery resulting in functional limitations due to the deficits listed below (see PT Problem List). Patient currently requires min guard assist for transfers and ambulation with RW. She does not want to try crutches due to abscess in left axilla. Will need to ascend 3 steps prior to discharge home.  Patient will benefit from skilled PT to increase their independence and safety with mobility to allow discharge to the venue listed below.          Recommendations for follow up therapy are one component of a multi-disciplinary discharge planning process, led by the attending physician.  Recommendations may be updated based on patient status, additional functional criteria and insurance authorization.  Follow Up Recommendations Follow physician's recommendations for discharge plan and follow up therapies      Assistance Recommended at Discharge PRN  Patient can return home with the following  Help with stairs or ramp for entrance    Equipment Recommendations Rolling walker (2 wheels);BSC/3in1  Recommendations for Other Services       Functional Status Assessment Patient has had a recent decline in their functional status and demonstrates the ability to make significant improvements in function in a reasonable and predictable amount of time.     Precautions / Restrictions Precautions Precautions: Knee Precaution Booklet Issued: No Precaution Comments: Educated not to put a  pillow under her knee Restrictions Weight Bearing Restrictions: No      Mobility  Bed Mobility Overal bed mobility: Modified Independent             General bed mobility comments: incr time and effort, but pt did not want anyone to touch her leg    Transfers Overall transfer level: Needs assistance Equipment used: Rolling walker (2 wheels) Transfers: Sit to/from Stand Sit to Stand: Min guard           General transfer comment: vc for hand placement; from bed minguard, from 3n1 supervision    Ambulation/Gait Ambulation/Gait assistance: Min guard Gait Distance (Feet): 65 Feet Assistive device: Rolling walker (2 wheels) Gait Pattern/deviations: Step-to pattern, Decreased stride length, Decreased weight shift to left Gait velocity: very slow     General Gait Details: initial cues for sequencing and then pt able to maintain  Stairs            Wheelchair Mobility    Modified Rankin (Stroke Patients Only)       Balance Overall balance assessment: Needs assistance         Standing balance support: Bilateral upper extremity supported, Reliant on assistive device for balance Standing balance-Leahy Scale: Poor Standing balance comment: due to pain, self-limiting wb'g on LLE                             Pertinent Vitals/Pain Pain Assessment Pain Assessment: 0-10 Pain Score: 8  Pain Location: left knee Pain Descriptors / Indicators: Constant, Grimacing, Guarding Pain Intervention(s): Limited activity within patient's tolerance, Monitored during session, Repositioned    Home Living Family/patient expects to be  discharged to:: Private residence     Type of Home: House Home Access: Stairs to enter Entrance Stairs-Rails: Right Entrance Stairs-Number of Steps: 3   Home Layout: One level Home Equipment: None      Prior Function Prior Level of Function : Independent/Modified Independent             Mobility Comments: reports did not  have crutches or walker PTA       Hand Dominance        Extremity/Trunk Assessment   Upper Extremity Assessment Upper Extremity Assessment: Overall WFL for tasks assessed    Lower Extremity Assessment Lower Extremity Assessment: LLE deficits/detail LLE Deficits / Details: ace wrap from foot to thigh; hemovac present; pt bent knee at most 20 degrees when moving stand to sit    Cervical / Trunk Assessment Cervical / Trunk Assessment: Normal  Communication   Communication: No difficulties  Cognition Arousal/Alertness: Lethargic, Suspect due to medications Behavior During Therapy: Flat affect Overall Cognitive Status: Within Functional Limits for tasks assessed                                          General Comments      Exercises     Assessment/Plan    PT Assessment Patient needs continued PT services  PT Problem List Decreased range of motion;Decreased activity tolerance;Decreased balance;Decreased mobility;Decreased knowledge of use of DME;Decreased knowledge of precautions;Pain       PT Treatment Interventions DME instruction;Gait training;Stair training;Functional mobility training;Therapeutic activities;Therapeutic exercise;Patient/family education    PT Goals (Current goals can be found in the Care Plan section)  Acute Rehab PT Goals Patient Stated Goal: none stated; focused on pain PT Goal Formulation: With patient Time For Goal Achievement: 01/04/22 Potential to Achieve Goals: Good    Frequency Min 5X/week     Co-evaluation               AM-PAC PT "6 Clicks" Mobility  Outcome Measure Help needed turning from your back to your side while in a flat bed without using bedrails?: None Help needed moving from lying on your back to sitting on the side of a flat bed without using bedrails?: None Help needed moving to and from a bed to a chair (including a wheelchair)?: A Little Help needed standing up from a chair using your arms  (e.g., wheelchair or bedside chair)?: A Little Help needed to walk in hospital room?: A Little Help needed climbing 3-5 steps with a railing? : A Lot 6 Click Score: 19    End of Session   Activity Tolerance: Patient limited by pain Patient left: in bed;with call bell/phone within reach;with family/visitor present (left lower leg elevated on pillow) Nurse Communication: Mobility status;Other (comment) (urinated) PT Visit Diagnosis: Difficulty in walking, not elsewhere classified (R26.2)    Time: 6387-5643 PT Time Calculation (min) (ACUTE ONLY): 29 min   Charges:   PT Evaluation $PT Eval Low Complexity: 1 Low PT Treatments $Gait Training: 8-22 mins         Jerolyn Center, PT Acute Rehabilitation Services  Office [phone]   Zena Amos 12/21/2021, 4:25 PM

## 2021-12-21 NOTE — ED Notes (Signed)
RN unclothed pt and gave belongings to mother at bedside. Pts jewelry and phones given to pts mother. Consent signed.

## 2021-12-21 NOTE — Transfer of Care (Signed)
Immediate Anesthesia Transfer of Care Note  Patient: Danielle Norman  Procedure(s) Performed: IRRIGATION AND DEBRIDEMENT, KNEE (Left: Knee)  Patient Location: PACU  Anesthesia Type:General  Level of Consciousness: awake and drowsy  Airway & Oxygen Therapy: Patient Spontanous Breathing and Patient connected to face mask oxygen  Post-op Assessment: Report given to RN and Post -op Vital signs reviewed and stable  Post vital signs: Reviewed and stable  Last Vitals:  Vitals Value Taken Time  BP 146/96 12/21/21 1117  Temp    Pulse 107 12/21/21 1121  Resp 18 12/21/21 1121  SpO2 100 % 12/21/21 1121  Vitals shown include unvalidated device data.  Last Pain:  Vitals:   12/21/21 0933  TempSrc: Oral  PainSc:          Complications: No notable events documented.

## 2021-12-21 NOTE — Progress Notes (Signed)
Pharmacy Antibiotic Note  Danielle Norman is a 21 y.o. female admitted on 12/20/2021 with  septic arthritis .  Pharmacy has been consulted for vancomycin dosing.  Plan: Vancomycin 2000mg  x1 then 1250mg  IV Q8H. Goal AUC 400-550.  Expected AUC 485.  SCr used 0.8 (actual 0.58).   Height: 5' 11.5" (181.6 cm) Weight: 98.3 kg (216 lb 11.4 oz) IBW/kg (Calculated) : 71.95  Temp (24hrs), Avg:99.8 F (37.7 C), Min:99.5 F (37.5 C), Max:100 F (37.8 C)  Recent Labs  Lab 12/20/21 2255  WBC 13.9*  CREATININE 0.58    Estimated Creatinine Clearance: 144.9 mL/min (by C-G formula based on SCr of 0.58 mg/dL).    No Known Allergies  Thank you for allowing pharmacy to be a part of this patient's care.  , PharmD, BCPS  12/21/2021 1:23 AM

## 2021-12-21 NOTE — ED Notes (Addendum)
Pt tearful with c/o 10/10  knee and hand joint pain. Pain unrelieved by oxy. MD [place] paged.

## 2021-12-21 NOTE — Progress Notes (Signed)
Consult received from EDP for 2 days of worsening left knee pain, swelling, and difficulty weightbearing.  The left knee was aspirated in the ER yielding 127,765 white blood cells with 99% neutrophils.  Gram stain negative, culture pending.  Serum WBC 13.1, sed rate 101 mm/h, C-reactive protein 342 mg/L.  She was then given IV vancomycin and cefepime after the aspiration was performed.  She was admitted to the hospitalist.  She will require operative debridement of her left knee for native septic arthritis.  Plan for surgery today.  Continue NPO.  Hold chemical DVT prophylaxis.  After surgery, she will need a PICC line and ID consult for prolonged IV antibiotics.

## 2021-12-21 NOTE — Assessment & Plan Note (Addendum)
Sepsis present on admission.  Left knee with purulent synovial fluid, recent diagnosis of gonorrhea. 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  Plan for surgical intervention to left knee today.  Follow with orthopedics recommendations, she is NPO this morning.

## 2021-12-21 NOTE — Progress Notes (Addendum)
Progress Note   Patient: Danielle Norman TKZ:[id number] DOB: [birth year] DOA: 12/20/2021     0 DOS: the patient was seen and examined on 12/21/2021   Brief hospital course: Danielle Norman was admitted with the working diagnosis of left knee septic arthritis.   21 y.o. female with medical history significant for insulin-dependent type 2 diabetes, hidradenitis suppurativa who is admitted with concern for septic joint of the left knee.  Joint aspiration performed in the ED with labs pending.  Started on empiric antibiotics Positive left knee trauma on 12/18/21, with worsening joint edema and tenderness, to the point she had difficulty ambulating. Local left knee tenderness and increase temperature.   ED visit on 10/2021 with diagnosis of bacterial vaginosis, treated with flagyl, work up positive for gonorrhea and negative for chlamydia. 10/22/21 had IM ceftriaxone one dose as treatment for gonorrhea.   On her initial physical examination her blood pressure 140/96, HR 133, RR 16, Temp 100.0 and 02 saturation 95%, lungs clear to auscultation, heart with S1 and S2 present and rhythmic, abdomen soft and no lower extremity edema. Left knee with edema and decreased range of motion, positive erythema, positive edema and decreased of motion in bilateral wrists. Erythema and tenderness at the right lateral wrist joint and left index MCP joint.   Na 127, K 3,5 Cl 93, bicarbonate 19 glucose 353 bun 7 cr 0,58 anion gap 15  CRP 34 Wbc 13,9 hgb 12,2 plt 267    Urine analysis with > 500 glucose, 30 protein, SG 1,042, 21-50 wbc.,  Left knee synovial fluid 127,765 wbc, Neutrophil 99.  Gram stain negative. Culture pending.   Left knee CT scan with moderate joint effusion.   Patient was placed on IV fluids and IV antibiotics. Consulted orthopedics and plan for surgical intervention today.   Assessment and Plan: * Septic joint of left knee joint (HCC) Sepsis present on admission.  Left knee with purulent  synovial fluid, recent diagnosis of gonorrhea. 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  Plan for surgical intervention to left knee today.  Follow with orthopedics recommendations, she is NPO this morning.   Type 2 diabetes mellitus with hyperglycemia, with long-term current use of insulin (HCC) Patient with mild ketosis on admission, follow up anion gap is down to 13, mild DKA Glucose is 303 mg/dl Patient currently is NPO for surgical procedure.  At home patient on 20 units of basal insulin.  Plan to continue with basal insulin 10 units for now, patient is NPO and will need close capillary glucose monitoring. If worsening hyperglycemia patient will need insulin drip for tight glucose monitoring and prevent worsening ketoacidosis.   Gonorrhea Recent diagnosis of gonorrhea.  Had treatment with ceftriaxone IM on 10/22/21.   Plan to continue with ceftriaxone IV.  Hyponatremia Hypokalemia.   Renal function with serum cr stable, Na 130 and K at 3,3.  Plan to continue K correction with IV Kcl will add 60 meq and continue hydration with isotonic saline at 100 ml per hr Follow up renal function and electrolytes in am. Avoid hypotension and nephrotoxic medications.         Subjective: Patient continue to have left knee pain, improved with local heating pad, no nausea or vomiting, no dyspnea or chest pain. Positive pain bilateral wrists.  Physical Exam: Vitals:   12/20/21 2339 12/21/21 0100 12/21/21 0413 12/21/21 0700  BP: 140/69  116/84 115/66  Pulse: (!) 115  (!) 121 (!) 110  Resp: 20  (!) 23 18  Temp: 99.5 F (37.5 C)     TempSrc: Oral     SpO2: 100%  99% 99%  Weight:  98.3 kg    Height:  5' 11.5" (1.816 m)     Neurology awake and  alert ENT with no pallor Cardiovascular with S1 and S2 present and tachycardic with no gallops or murmurs, no rubs Respiratory with no rales or wheezing Abdomen not distended No lower extremity edema Left knee with edema and erythema, positive tender to palpation and decreased range of motion, positive tender erythema right wrist and left MTCP index.  Data Reviewed:    Family Communication: I spoke with patient's mother speaker phone at the bedside, we talked in detail about patient's condition, plan of care and prognosis and all questions were addressed.   Disposition: Status is: Inpatient Remains inpatient appropriate because: IV antibiotics and surgical intervention.   Planned Discharge Destination: Home    Author: Coralie Keens, MD 12/21/2021 9:06 AM  For on call review [email or web address].

## 2021-12-21 NOTE — Assessment & Plan Note (Signed)
Hypokalemia.   Renal function with serum cr stable, Na 130 and K at 3,3.  Plan to continue K correction with IV Kcl will add 60 meq and continue hydration with isotonic saline at 100 ml per hr Follow up renal function and electrolytes in am. Avoid hypotension and nephrotoxic medications.

## 2021-12-21 NOTE — Hospital Course (Addendum)
Danielle Norman was admitted with the working diagnosis of left knee septic arthritis.   21 y.o. female with medical history significant for insulin-dependent type 2 diabetes, hidradenitis suppurativa who is admitted with concern for septic joint of the left knee.  Joint aspiration performed in the ED with labs pending.  Started on empiric antibiotics Positive left knee trauma on 12/18/21, with worsening joint edema and tenderness, to the point she had difficulty ambulating. Local left knee tenderness and increase temperature.   ED visit on 10/2021 with diagnosis of bacterial vaginosis, treated with flagyl, work up positive for gonorrhea and negative for chlamydia. 10/22/21 had IM ceftriaxone one dose as treatment for gonorrhea.   On her initial physical examination her blood pressure 140/96, HR 133, RR 16, Temp 100.0 and 02 saturation 95%, lungs clear to auscultation, heart with S1 and S2 present and rhythmic, abdomen soft and no lower extremity edema. Left knee with edema and decreased range of motion, positive erythema, positive edema and decreased of motion in bilateral wrists. Erythema and tenderness at the right lateral wrist joint and left index MCP joint.   Na 127, K 3,5 Cl 93, bicarbonate 19 glucose 353 bun 7 cr 0,58 anion gap 15  CRP 34 Wbc 13,9 hgb 12,2 plt 267    Urine analysis with > 500 glucose, 30 protein, SG 1,042, 21-50 wbc.,  Left knee synovial fluid 127,765 wbc, Neutrophil 99.  Gram stain negative. Culture pending.   Left knee CT scan with moderate joint effusion.   Patient was placed on IV fluids and IV antibiotics. Consulted orthopedics and plan for surgical intervention today.

## 2021-12-21 NOTE — H&P (Signed)
History and Physical    Danielle Norman:[id number] DOB: Jul 16, 2000 DOA: 12/20/2021  PCP: Angeline Slim, MD  Patient coming from: Home  I have personally briefly reviewed patient's old medical records in [place]  Chief Complaint: Left knee pain  HPI: Danielle Norman is a 21 y.o. female with medical history significant for insulin-dependent type 2 diabetes, hidradenitis suppurativa who presented to the ED for evaluation of left knee pain.  Patient states she hit her left knee fairly hard while at work on 12/18/2021.  Had initial pain which resolved shortly afterwards.  She however developed recurrent left knee pain which has been persistent and worsening.  She has had swelling at the left knee as well as increased warmth to touch.  She has been having difficulty ambulating.    She says she has been increasingly relying on her hands and wrists to help her stand up due to the pain in her left knee.  She is now having increased pain and difficulty with movement in both of her wrists as well as some of the MCP joints in her hands.  She has been having subjective fevers.  Previously seen in the ED 10/20/2021 at which time diagnosed with bacterial vaginosis treated with Flagyl.  Also given doxycycline for treatment of hidradenitis abscess.  Work-up also positive for gonorrhea, negative for chlamydia.  She was seen in follow-up 10/22/2021 and given IM ceftriaxone for treatment of gonorrhea.  ED Course  Labs/Imaging on admission: I have personally reviewed following labs and imaging studies.  Initial vitals showed BP 140/96, pulse 133, RR 16, temp 100.0 F, SPO2 95% on room air.  Labs significant for WBC 13.9, hemoglobin 12.2, platelets 267,000, sodium 127 (133 when corrected for hyperglycemia), potassium 3.5, bicarb 19, BUN 7, creatinine 0.58, serum glucose 353, ESR 101, CRP 34.2.  Left knee x-ray showed suprapatellar joint effusion.  No fracture or dislocation identified.  CT left  knee without contrast negative for acute fracture or dislocation.  Moderate joint effusion noted.  Left knee joint aspiration/arthrocentesis performed by EDP.  5 cc purulent/yellow fluid aspirated.  Fluid culture, Gram stain, cell count and differential sent to lab.  EDP discussed with on-call orthopedics, Dr. Lyla Glassing, who will follow per discussion.  Patient was given IV vancomycin and cefepime.  The hospitalist service was consulted to admit for further evaluation and management.  Review of Systems: All systems reviewed and are negative except as documented in history of present illness above.   Past Medical History:  Diagnosis Date   Anxiety    Diabetes mellitus, type 2 ([place])    Diagnosed 10/2014, hbA1c 14.1% at diagnosis.  Started on insulin and metformin   Heart murmur     Past Surgical History:  Procedure Laterality Date   EYE MUSCLE SURGERY      Social History:  reports that she is a non-smoker but has been exposed to tobacco smoke. She has never used smokeless tobacco. She reports current drug use. Drug: Marijuana. She reports that she does not drink alcohol.  No Known Allergies  Family History  Problem Relation Age of Onset   Kidney disease Father    Diabetes Father        Reported as type 1 diabetes   Diabetes Maternal Aunt        type 2 diabetes, was on oral meds, now diet controlled   Thyroid disease Maternal Grandmother      Prior to Admission medications   Medication Sig Start Date  End Date Taking? Authorizing Provider  Accu-Chek FastClix Lancets MISC Check sugar 6 x daily 10/21/18   Hermenia Bers, NP  ACCU-CHEK GUIDE test strip CHECK BLOOD SUGAR UP TO 6 TIMES PER DAY 04/18/18   Hermenia Bers, NP  acetone, urine, test strip Check ketones per protocol 11/01/14   Janell Quiet, MD  blood glucose meter kit and supplies Dispense based on patient and insurance preference. Use up to four times daily as directed. (FOR ICD-10 E10.9, E11.9). 10/20/21   Tedd Sias, PA  glucagon 1 MG injection Use for Severe Hypoglycemia . Inject 1 mg intramuscularly if unresponsive, unable to swallow, unconscious and/or has seizure Patient taking differently: Inject 1 mg into the muscle once as needed. Use for Severe Hypoglycemia . Inject 1 mg intramuscularly if unresponsive, unable to swallow, unconscious and/or has seizure 11/01/14   Janell Quiet, MD  HYDROcodone-acetaminophen (NORCO/VICODIN) 5-325 MG tablet Take 1 tablet by mouth every 6 (six) hours as needed. 04/06/21   Rayna Sexton, PA-C  insulin aspart (NOVOLOG FLEXPEN) 100 UNIT/ML FlexPen INJECT UP TO 75 UNITS DAILY AS DIRECTED BY MD Patient taking differently: Inject 15-25 Units into the skin 3 (three) times daily with meals. Sliding scale 06/14/17   Hermenia Bers, NP  insulin degludec (TRESIBA) 100 UNIT/ML SOPN FlexTouch Pen Inject up to 50 units into the skin daily 09/22/18   Hermenia Bers, NP  insulin glargine (LANTUS SOLOSTAR) 100 UNIT/ML Solostar Pen Inject 20 Units into the skin daily. 10/20/21 04/07/24  Tedd Sias, PA  Insulin Pen Needle (BD PEN NEEDLE NANO U/F) 32G X 4 MM MISC BD PEN NEEDLES- BRAND SPECIFIC. INJECT INSULIN VIA INSULIN PEN 6 X DAILY 10/20/21   Pati Gallo S, PA  Insulin Pen Needle (PEN NEEDLES 3/16") 31G X 5 MM MISC 1 each by Does not apply route as needed. 10/20/21   Tedd Sias, PA  metFORMIN (GLUCOPHAGE) 1000 MG tablet Take 0.5 tablets (500 mg total) by mouth 2 (two) times daily. 10/20/21   Tedd Sias, PA  metroNIDAZOLE (FLAGYL) 500 MG tablet Take 1 tablet (500 mg total) by mouth 2 (two) times daily. 10/20/21   Tedd Sias, PA  norgestimate-ethinyl estradiol (ORTHO-CYCLEN,SPRINTEC,PREVIFEM) 0.25-35 MG-MCG tablet Take 1 tablet by mouth daily. 02/18/18   Wende Mott, CNM    Physical Exam: Vitals:   12/20/21 1552 12/20/21 2230 12/20/21 2339 12/21/21 0100  BP: (!) 148/96 108/84 140/69   Pulse: (!) 133 (!) 128 (!) 115   Resp: 16 (!) 22 20   Temp: 100  F (37.8 C)  99.5 F (37.5 C)   TempSrc: Oral  Oral   SpO2: 95% 100% 100%   Weight:    98.3 kg  Height:    5' 11.5" (1.816 m)   Constitutional: Resting supine in bed, NAD, calm, comfortable Eyes: EOMI, lids and conjunctivae normal ENMT: Mucous membranes are moist. Posterior pharynx clear of any exudate or lesions.Normal dentition.  Neck: normal, supple, no masses. Respiratory: clear to auscultation bilaterally, no wheezing, no crackles. Normal respiratory effort. No accessory muscle use.  Cardiovascular: Regular rate and rhythm, no murmurs / rubs / gallops. No extremity edema. 2+ pedal pulses. Abdomen: no tenderness, no masses palpated. No hepatosplenomegaly. Bowel sounds positive.  Musculoskeletal: Joint swelling and decreased ROM bilateral wrists, left knee.  Increased erythema at these joints as well as several of the MCP joints. Skin: Erythema to left knee, bilateral wrists, several MCP joints as above.  No open wounds, rash, palmar lesions. Neurologic:  Sensation intact. Strength diminished at bilateral wrist joints, left knee due to swelling otherwise intact. Psychiatric: Alert and oriented x 3. Normal mood.   EKG: Not performed.  Assessment/Plan Principal Problem:   Septic joint of left knee joint (HCC) Active Problems:   Type 2 diabetes mellitus with hyperglycemia, with long-term current use of insulin (HCC)   Willetta S Spegal is a 21 y.o. female with medical history significant for insulin-dependent type 2 diabetes, hidradenitis suppurativa who is admitted with concern for septic joint of the left knee.  Joint aspiration performed in the ED with labs pending.  Started on empiric antibiotics  Assessment and Plan: * Septic joint of left knee joint ([place]) Clinical picture concerning for septic arthritis of the left knee. Inflammatory markers significantly elevated with ESR 101, CRP 34.2.  Joint aspiration performed by EDP with 5 cc purulent and yellow fluid.  Labs pending.  Also  reporting bilateral wrist and hand joint pain/swelling.  Disseminated gonococcal infection possible given recent diagnosis. -Continue empiric IV vancomycin and ceftriaxone -Follow joint fluid culture, Gram stain, cell count/differential -Add gonococcal culture -EDP discussed with orthopedics who are aware of case  Type 2 diabetes mellitus with hyperglycemia, with long-term current use of insulin ([place]) Hyperglycemic without evidence of DKA or HHS.  States she was off insulin for a long period of time before restarting 1.5 months ago and reports adherence since. -Start Semglee 22 units daily -Add resistant SSI with HS coverage -Hold metformin  DVT prophylaxis: SCDs Start: 12/21/21 0110 Code Status: Full code Family Communication: Discussed with patient, she has discussed with family Disposition Plan: From home, dispo pending clinical progress Consults called: EDP discussed with on-call orthopedics, Dr. Lyla Glassing Severity of Illness: The appropriate patient status for this patient is INPATIENT. Inpatient status is judged to be reasonable and necessary in order to provide the required intensity of service to ensure the patient's safety. The patient's presenting symptoms, physical exam findings, and initial radiographic and laboratory data in the context of their chronic comorbidities is felt to place them at high risk for further clinical deterioration. Furthermore, it is not anticipated that the patient will be medically stable for discharge from the hospital within 2 midnights of admission.   * I certify that at the point of admission it is my clinical judgment that the patient will require inpatient hospital care spanning beyond 2 midnights from the point of admission due to high intensity of service, high risk for further deterioration and high frequency of surveillance required.Zada Finders MD [facility]  If 7PM-7AM, please contact night-coverage [email or web address]  12/21/2021, 1:28 AM

## 2021-12-21 NOTE — Progress Notes (Signed)
Micro lab called and said they could not do the GC culture sent from OR said it was not a acceptable specimen type she even called an outside lab to see if it can be sent out and it could not be a send out lab either.   Elmin Wiederholt, Kae Heller, RN

## 2021-12-21 NOTE — ED Notes (Signed)
Pt is requesting a stronger pain medication at this time. Oxycodone offered as it is ordered PRN. Pt refused and states it has not helped her. MD notified at this time. Waiting for new orders. Will continue to monitor.

## 2021-12-22 ENCOUNTER — Encounter (HOSPITAL_COMMUNITY): Payer: Self-pay | Admitting: Orthopedic Surgery

## 2021-12-22 DIAGNOSIS — A549 Gonococcal infection, unspecified: Secondary | ICD-10-CM | POA: Diagnosis not present

## 2021-12-22 DIAGNOSIS — E871 Hypo-osmolality and hyponatremia: Secondary | ICD-10-CM | POA: Diagnosis not present

## 2021-12-22 DIAGNOSIS — M25562 Pain in left knee: Secondary | ICD-10-CM | POA: Diagnosis not present

## 2021-12-22 DIAGNOSIS — M009 Pyogenic arthritis, unspecified: Secondary | ICD-10-CM | POA: Diagnosis not present

## 2021-12-22 DIAGNOSIS — M25462 Effusion, left knee: Secondary | ICD-10-CM

## 2021-12-22 LAB — CBC WITH DIFFERENTIAL/PLATELET
Abs Immature Granulocytes: 0.04 10*3/uL (ref 0.00–0.07)
Basophils Absolute: 0 10*3/uL (ref 0.0–0.1)
Basophils Relative: 0 %
Eosinophils Absolute: 0 10*3/uL (ref 0.0–0.5)
Eosinophils Relative: 0 %
HCT: 28.9 % — ABNORMAL LOW (ref 36.0–46.0)
Hemoglobin: 9.8 g/dL — ABNORMAL LOW (ref 12.0–15.0)
Immature Granulocytes: 0 %
Lymphocytes Relative: 21 %
Lymphs Abs: 2.2 10*3/uL (ref 0.7–4.0)
MCH: 27.8 pg (ref 26.0–34.0)
MCHC: 33.9 g/dL (ref 30.0–36.0)
MCV: 81.9 fL (ref 80.0–100.0)
Monocytes Absolute: 1.2 10*3/uL — ABNORMAL HIGH (ref 0.1–1.0)
Monocytes Relative: 11 %
Neutro Abs: 7.3 10*3/uL (ref 1.7–7.7)
Neutrophils Relative %: 68 %
Platelets: 242 10*3/uL (ref 150–400)
RBC: 3.53 MIL/uL — ABNORMAL LOW (ref 3.87–5.11)
RDW: 11.9 % (ref 11.5–15.5)
WBC: 10.7 10*3/uL — ABNORMAL HIGH (ref 4.0–10.5)
nRBC: 0 % (ref 0.0–0.2)

## 2021-12-22 LAB — GLUCOSE, BODY FLUID OTHER: Glucose, Body Fluid Other: 117 mg/dL

## 2021-12-22 LAB — GLUCOSE, CAPILLARY
Glucose-Capillary: 256 mg/dL — ABNORMAL HIGH (ref 70–99)
Glucose-Capillary: 246 mg/dL — ABNORMAL HIGH (ref 70–99)
Glucose-Capillary: 229 mg/dL — ABNORMAL HIGH (ref 70–99)
Glucose-Capillary: 203 mg/dL — ABNORMAL HIGH (ref 70–99)

## 2021-12-22 LAB — BASIC METABOLIC PANEL
Anion gap: 10 (ref 5–15)
BUN: 6 mg/dL (ref 6–20)
CO2: 20 mmol/L — ABNORMAL LOW (ref 22–32)
Calcium: 8.5 mg/dL — ABNORMAL LOW (ref 8.9–10.3)
Chloride: 102 mmol/L (ref 98–111)
Creatinine, Ser: 0.62 mg/dL (ref 0.44–1.00)
GFR, Estimated: >60 mL/min (ref >60)
Glucose, Bld: 258 mg/dL — ABNORMAL HIGH (ref 70–99)
Potassium: 3.7 mmol/L (ref 3.5–5.1)
Sodium: 132 mmol/L — ABNORMAL LOW (ref 135–145)

## 2021-12-22 LAB — MAGNESIUM: Magnesium: 1.7 mg/dL (ref 1.7–2.4)

## 2021-12-22 MED ORDER — INSULIN ASPART 100 UNIT/ML IJ SOLN
3.0000 [IU] | Freq: Three times a day (TID) | INTRAMUSCULAR | Status: DC
  Administered 2021-12-22 – 2021-12-24 (×6): 3 [IU] via SUBCUTANEOUS

## 2021-12-22 MED ORDER — OXYCODONE HCL 5 MG PO TABS
10.0000 mg | ORAL_TABLET | ORAL | Status: DC
  Administered 2021-12-22 – 2021-12-27 (×32): 10 mg via ORAL
  Filled 2021-12-22 (×33): qty 2

## 2021-12-22 MED ORDER — MORPHINE SULFATE (PF) 2 MG/ML IV SOLN
2.0000 mg | INTRAVENOUS | Status: DC | PRN
  Administered 2021-12-22 – 2021-12-26 (×14): 2 mg via INTRAVENOUS
  Filled 2021-12-22 (×14): qty 1

## 2021-12-22 MED ORDER — INSULIN GLARGINE-YFGN 100 UNIT/ML ~~LOC~~ SOLN
14.0000 [IU] | Freq: Two times a day (BID) | SUBCUTANEOUS | Status: DC
  Administered 2021-12-22 – 2021-12-23 (×2): 14 [IU] via SUBCUTANEOUS
  Filled 2021-12-22 (×3): qty 0.14

## 2021-12-22 NOTE — Progress Notes (Signed)
[facility] Progress Note  Patient: Danielle Norman     GTX:[id number]  DOA: 12/20/2021   PCP: Christel Mormon, MD       Brief hospital course: This is a 21 year old female who has insulin requiring diabetes mellitus, hidradenitis suppurativa who presented to the ED on 7/9 for knee pain after hitting it at work on 7/6.  She is also noted swelling of the knee and increased warmth with difficulty ambulating. Previously seen in the ED 10/20/2021 at which time diagnosed with bacterial vaginosis treated with Flagyl.  Also given doxycycline for treatment of hidradenitis abscess.  Work-up also positive for gonorrhea, negative for chlamydia.  She was seen in follow-up 10/22/2021 and given IM ceftriaxone for treatment of gonorrhea.    Subjective:  Complaining of severe pain in her left knee.  Last night heard Dilaudid was discontinued in favor of morphine because she stated that it did not work and she no longer wanted it.  Today she states morphine is lasting only about 1 hour.  She bumped her knee while working with physical therapy this morning and it has caused her pain to increase.  Assessment and Plan: Principal Problem:   Septic joint of left knee joint (HCC) -7/9 > Joint fluid revealed 127,765 WBC, 99% of which were neutrophils - No organisms were seen on the Gram stain -CRP was 34.2 -WBC 13.9> 10.7 today -7/9-open arthrotomy and drainage of left knee-synovium sent for aerobic, anaerobic and gonococcal culture - Currently receiving vancomycin and ceftriaxone -I have increase morphine from 1 mg to 2 mg every 4 hours as needed for breakthrough and added oxycodone 10 mg every 4 hours--continue Toradol 15 mg 4 times daily  Active Problems: Gonorrhea -Treated with IM Rocephin on 10/22/2021    Type 2 diabetes mellitus with hyperglycemia, with long-term current use of insulin (HCC) -Sugars are still in the 2-3 100s-increase Semglee from 10 to 14 units twice daily-add 3 units of NovoLog  with meals and continue sliding scale - A1c in epic was 11.5 in 2019-I have ordered another   Hyponatremia -Sodium 127-likely dehydration secondary to severe hyperglycemia - Continue IV fluids  Hypokalemia - Resolved-follow   DVT prophylaxis:  enoxaparin (LOVENOX) injection 40 mg Start: 12/22/21 0800 SCDs Start: 12/21/21 1257   Code Status: Full Code  Consultants: Orthopedic surgery, infectious disease Level of Care: Level of care: Med-Surg Disposition Plan:  Status is: Inpatient Remains inpatient appropriate because: Septic knee joint  Objective:   Vitals:   12/22/21 0036 12/22/21 0248 12/22/21 0519 12/22/21 0825  BP: 139/88  132/77 133/81  Pulse: (!) 130 89 (!) 116 (!) 108  Resp: 20  18 17   Temp: 99.9 F (37.7 C)  98.9 F (37.2 C) 99.1 F (37.3 C)  TempSrc: Oral   Oral  SpO2: 98%  100% 100%  Weight:      Height:       Filed Weights   12/21/21 0100 12/21/21 1303  Weight: 98.3 kg 97.5 kg   Exam: General exam: Appears uncomfortable  HEENT: PERRLA, oral mucosa moist, no sclera icterus or thrush Respiratory system: Clear to auscultation. Respiratory effort normal. Cardiovascular system: S1 & S2 heard, regular rate and rhythm Gastrointestinal system: Abdomen soft, non-tender, nondistended. Normal bowel sounds   Central nervous system: Alert and oriented. No focal neurological deficits. Extremities: No cyanosis, clubbing -drain present in left knee-dressing not opened Skin: No rashes or ulcers   Imaging and lab data was personally reviewed    CBC: Recent Labs  Lab 12/20/21 2255 12/21/21 0440 12/21/21 1416 12/22/21 0031  WBC 13.9* 13.1* 12.0* 10.7*  NEUTROABS 9.0*  --   --  7.3  HGB 12.2 11.7* 11.4* 9.8*  HCT 36.0 34.5* 33.4* 28.9*  MCV 82.6 82.7 82.1 81.9  PLT 267 244 250 242   Basic Metabolic Panel: Recent Labs  Lab 12/20/21 2255 12/21/21 0440 12/21/21 1416 12/22/21 0031  NA 127* 130*  --  132*  K 3.5 3.3*  --  3.7  CL 93* 97*  --  102  CO2 19*  20*  --  20*  GLUCOSE 353* 303*  --  258*  BUN 7 6  --  6  CREATININE 0.58 0.62 0.43* 0.62  CALCIUM 8.9 8.5*  --  8.5*  MG  --   --   --  1.7   GFR: Estimated Creatinine Clearance: 143.1 mL/min (by C-G formula based on SCr of 0.62 mg/dL).  Scheduled Meds:  docusate sodium  100 mg Oral BID   enoxaparin (LOVENOX) injection  40 mg Subcutaneous Q24H   insulin aspart  0-20 Units Subcutaneous QID   insulin aspart  0-5 Units Subcutaneous QHS   insulin glargine-yfgn  10 Units Subcutaneous BID   oxyCODONE  10 mg Oral Q4H   pantoprazole  40 mg Oral Daily   senna  1 tablet Oral BID   Continuous Infusions:  sodium chloride 1,000 mL (12/21/21 1452)   cefTRIAXone (ROCEPHIN)  IV 2 g (12/21/21 2235)   vancomycin 1,250 mg (12/22/21 0829)     LOS: 1 day   Author: Calvert Cantor  12/22/2021 1:09 PM

## 2021-12-22 NOTE — Progress Notes (Signed)
   12/21/21 2132  Assess: MEWS Score  Temp (!) 100.4 F (38 C)  BP 133/85  MAP (mmHg) 99  Pulse Rate (!) 118  Resp 18  SpO2 99 %  O2 Device Room Air  Assess: MEWS Score  MEWS Temp 0  MEWS Systolic 0  MEWS Pulse 2  MEWS RR 0  MEWS LOC 0  MEWS Score 2  MEWS Score Color Yellow  Assess: if the MEWS score is Yellow or Red  Were vital signs taken at a resting state? Yes  Focused Assessment Change from prior assessment (see assessment flowsheet)  Does the patient meet 2 or more of the SIRS criteria? No  MEWS guidelines implemented *See Row Information* Yes  Treat  MEWS Interventions Administered scheduled meds/treatments  Pain Scale 0-10  Pain Score 9  Pain Type Surgical pain  Pain Location Knee  Pain Orientation Left  Pain Descriptors / Indicators Aching;Throbbing  Pain Frequency Constant  Pain Onset On-going  Pain Intervention(s) Medication (See eMAR)  Take Vital Signs  Increase Vital Sign Frequency  Yellow: Q 2hr X 2 then Q 4hr X 2, if remains yellow, continue Q 4hrs  Escalate  MEWS: Escalate Yellow: discuss with charge nurse/RN and consider discussing with provider and RRT  Notify: Charge Nurse/RN  Name of Charge Nurse/RN Notified Kim, RN  Date Charge Nurse/RN Notified 12/21/21  Time Charge Nurse/RN Notified 2150  Assess: SIRS CRITERIA  SIRS Temperature  0  SIRS Pulse 1  SIRS Respirations  0  SIRS WBC 0  SIRS Score Sum  1

## 2021-12-22 NOTE — Progress Notes (Signed)
Mobility Specialist Criteria Algorithm Info.   12/22/21 1117  Mobility  Activity Refused mobility   Patient declined for reasons unclear to writer. Stated that yesterday while working w/PT she hit her knee on IV pole while using restroom and still having pain since then. Will f/u as time permits.  12/22/2021 11:17 AM  [place] Minola Guin, CMS, BS [facility]  Phone:[phone] Office: [phone]

## 2021-12-22 NOTE — Progress Notes (Signed)
Subjective:  Patient reports pain as moderate.  Denies N/V/CP/SOB/Abd pain. Last temperature 99.1. She denies having chills. She states that yesterday she hit her knee on the IV pole after PT yesterday and it has been painful. She has not been putting ice on her knee. We discussed that cryotherapy would be beneficial for her. She is going to notify the nurse to bring her ice.   All questions solicited and answered.   Objective:   VITALS:   Vitals:   12/22/21 0036 12/22/21 0248 12/22/21 0519 12/22/21 0825  BP: 139/88  132/77 133/81  Pulse: (!) 130 89 (!) 116 (!) 108  Resp: 20  18 17   Temp: 99.9 F (37.7 C)  98.9 F (37.2 C) 99.1 F (37.3 C)  TempSrc: Oral   Oral  SpO2: 98%  100% 100%  Weight:      Height:        Patient is lying in bed. NAD.  Neurologically intact ABD soft Neurovascular intact Sensation intact distally Intact pulses distally Dorsiflexion/Plantar flexion intact Incision: dressing C/D/I No cellulitis present Compartment soft She is wearing an ace bandage. Hemovac drain intact. Bandage clean and dry.150cc SS fluid. Continue drain.   Lab Results  Component Value Date   WBC 10.7 (H) 12/22/2021   HGB 9.8 (L) 12/22/2021   HCT 28.9 (L) 12/22/2021   MCV 81.9 12/22/2021   PLT 242 12/22/2021   BMET    Component Value Date/Time   NA 132 (L) 12/22/2021 0031   K 3.7 12/22/2021 0031   CL 102 12/22/2021 0031   CO2 20 (L) 12/22/2021 0031   GLUCOSE 258 (H) 12/22/2021 0031   BUN 6 12/22/2021 0031   CREATININE 0.62 12/22/2021 0031   CALCIUM 8.5 (L) 12/22/2021 0031   GFRNONAA >60 12/22/2021 0031   Results for orders placed or performed during the hospital encounter of 12/20/21  Body fluid culture w Gram Stain     Status: None (Preliminary result)   Collection Time: 12/21/21 12:22 AM   Specimen: KNEE; Body Fluid  Result Value Ref Range Status   Specimen Description KNEE  Final   Special Requests NONE  Final   Gram Stain   Final    MODERATE WBC  PRESENT,BOTH PMN AND MONONUCLEAR NO ORGANISMS SEEN    Culture   Final    NO GROWTH 1 DAY Performed at [facility], [address], [place], [place]    Report Status PENDING  Incomplete  Culture, blood (Routine X 2) w Reflex to ID Panel     Status: None (Preliminary result)   Collection Time: 12/21/21  5:00 AM   Specimen: BLOOD  Result Value Ref Range Status   Specimen Description BLOOD RIGHT ANTECUBITAL  Final   Special Requests   Final    BOTTLES DRAWN AEROBIC AND ANAEROBIC Blood Culture adequate volume   Culture   Final    NO GROWTH 1 DAY Performed at [facility], [address], [place], [place]    Report Status PENDING  Incomplete  Aerobic/Anaerobic Culture w Gram Stain (surgical/deep wound)     Status: None (Preliminary result)   Collection Time: 12/21/21 10:43 AM   Specimen: Synovium; Tissue  Result Value Ref Range Status   Specimen Description TISSUE  Final   Special Requests LEFT KNEE SYNOVIUM  Final   Gram Stain   Final    RARE WBC PRESENT,BOTH PMN AND MONONUCLEAR NO ORGANISMS SEEN    Culture   Final  NO GROWTH < 24 HOURS Performed at [facility], [address], [place], [place] [address]    Report Status PENDING  Incomplete  Culture, blood (Routine X 2) w Reflex to ID Panel     Status: None (Preliminary result)   Collection Time: 12/21/21  2:16 PM   Specimen: BLOOD  Result Value Ref Range Status   Specimen Description BLOOD SITE NOT SPECIFIED  Final   Special Requests   Final    BOTTLES DRAWN AEROBIC ONLY Blood Culture results may not be optimal due to an inadequate volume of blood received in culture bottles   Culture   Final    NO GROWTH < 24 HOURS Performed at [facility], [address], [place], [place] [address]    Report Status PENDING  Incomplete       Assessment/Plan: 1 Day Post-Op   Principal Problem:   Septic joint of left knee joint (HCC) Active Problems:   Type 2 diabetes mellitus with  hyperglycemia, with long-term current use of insulin (HCC)   Gonorrhea   Hyponatremia  Synovial fluid culture and tissue culture results pending. Continue broad spectrum antibiotics ceftriaxone and vancomycin.   Continue hemovac drain and record amount in drain.   WBAT with walker/crutches.  DVT ppx: Lovenox PO pain control: Oxycodone, toradol, tylenol.  PT/OT: PT worked with patient yesterday and she ambulated 65 feet with PT yesterday. She declined crutches due to abscess under arm.  Dispo: Disposition per medical team. Continue hemovac drain until less than 30cc of drainage per shift. Drain will need to be removed prior to discharge. Continue to follow cultures. Continue IV antibiotics. PICC line and Infectious disease consult for IV antibiotic selection and duration. Cryotherapy for knee as needed for pain and swelling.    Clois Dupes, PA-C 12/22/2021, 1:38 PM  [facility]  [facility] [address], [place], [place] [address]

## 2021-12-22 NOTE — Progress Notes (Signed)
Physical Therapy Treatment Patient Details Name: Danielle Norman MRN: [id number] DOB: 08/29/2000 Today's Date: 12/22/2021   History of Present Illness 21 y.o. female who presented to the ED 12/20/21 for evaluation of left knee pain. Left knee x-ray showed suprapatellar joint effusion.  No fracture or dislocation identified. +septic arthritis; +aspiration; 7/9 Open arthrotomy and drainage left knee.  PMH significant for insulin-dependent type 2 diabetes, hidradenitis suppurativa    PT Comments    Patient able to progress to walking 105 ft with RW this session. Requires supervision (cuing for foot flat, extending left knee, and progressing to step-through pattern). Will attempt stairs 7/11.    Recommendations for follow up therapy are one component of a multi-disciplinary discharge planning process, led by the attending physician.  Recommendations may be updated based on patient status, additional functional criteria and insurance authorization.  Follow Up Recommendations  Follow physician's recommendations for discharge plan and follow up therapies (OPPT when MD clears patient)     Assistance Recommended at Discharge PRN  Patient can return home with the following Help with stairs or ramp for entrance   Equipment Recommendations  Rolling walker (2 wheels);BSC/3in1    Recommendations for Other Services       Precautions / Restrictions Precautions Precautions: Knee Precaution Booklet Issued: No Precaution Comments: Educated not to put a pillow under her knee Restrictions Weight Bearing Restrictions: No     Mobility  Bed Mobility Overal bed mobility: Needs Assistance Bed Mobility: Supine to Sit, Sit to Supine     Supine to sit: Modified independent (Device/Increase time) Sit to supine: Min assist   General bed mobility comments: assist to raise leg up onto bed    Transfers Overall transfer level: Needs assistance Equipment used: Rolling walker (2 wheels) Transfers: Sit  to/from Stand Sit to Stand: Supervision           General transfer comment: no cues needed    Ambulation/Gait Ambulation/Gait assistance: Supervision Gait Distance (Feet): 105 Feet Assistive device: Rolling walker (2 wheels) Gait Pattern/deviations: Step-to pattern, Decreased stride length, Decreased weight shift to left, Step-through pattern, Knee flexed in stance - left Gait velocity: very slow     General Gait Details: cues for left knee extension in stance; progressed from step-to to step-through, but reverted to step-to due to incr pain   Stairs             Wheelchair Mobility    Modified Rankin (Stroke Patients Only)       Balance Overall balance assessment: Needs assistance         Standing balance support: Bilateral upper extremity supported, Reliant on assistive device for balance Standing balance-Leahy Scale: Poor Standing balance comment: due to pain, self-limiting wb'g on LLE                            Cognition Arousal/Alertness: Awake/alert Behavior During Therapy: Flat affect Overall Cognitive Status: Within Functional Limits for tasks assessed                                          Exercises Other Exercises Other Exercises: encouraged AROM ankle and knee; pt reports she wants to ice knee right now and will do later    General Comments        Pertinent Vitals/Pain Pain Assessment Pain Assessment: 0-10 Pain Score: 5  Pain  Location: left knee Pain Descriptors / Indicators: Constant, Grimacing, Guarding Pain Intervention(s): Limited activity within patient's tolerance, Monitored during session, Premedicated before session    Home Living                          Prior Function            PT Goals (current goals can now be found in the care plan section) Acute Rehab PT Goals Patient Stated Goal: none stated; focused on pain PT Goal Formulation: With patient Time For Goal Achievement:  01/04/22 Potential to Achieve Goals: Good Progress towards PT goals: Progressing toward goals    Frequency    Min 5X/week      PT Plan Current plan remains appropriate    Co-evaluation              AM-PAC PT "6 Clicks" Mobility   Outcome Measure  Help needed turning from your back to your side while in a flat bed without using bedrails?: None Help needed moving from lying on your back to sitting on the side of a flat bed without using bedrails?: None Help needed moving to and from a bed to a chair (including a wheelchair)?: A Little Help needed standing up from a chair using your arms (e.g., wheelchair or bedside chair)?: A Little Help needed to walk in hospital room?: A Little Help needed climbing 3-5 steps with a railing? : A Lot 6 Click Score: 19    End of Session   Activity Tolerance: Patient limited by pain Patient left: in bed;with call bell/phone within reach;with family/visitor present (left lower leg elevated on pillow) Nurse Communication: Mobility status PT Visit Diagnosis: Difficulty in walking, not elsewhere classified (R26.2)     Time: 1610-9604 PT Time Calculation (min) (ACUTE ONLY): 25 min  Charges:  $Gait Training: 23-37 mins                      Danielle Norman, PT Acute Rehabilitation Services  Office [phone]    Danielle Norman 12/22/2021, 3:17 PM

## 2021-12-22 NOTE — TOC Initial Note (Addendum)
Transition of Care [facility]) - Initial/Assessment Note    Patient Details  Name: Danielle Norman MRN: [id number] Date of Birth: 06-16-2000  Transition of Care [facility]) CM/SW Contact:    Tom-Johnson, Hershal Coria, RN Phone Number: 12/22/2021, 1:32 PM  Clinical Narrative:                  CM spoke with patient at bedside about needs for post hospital transition. Admitted for Septic Left Knee Joint. Had Open Arthrotomy and Drainage of left knee on 12/21/21. Currently on IV abx.  From home with parents. Not in school and does not have children. Currently employed at [employer]. Independent with care and drive self prior to admission.  PCP is [name], Althea Grimmer, MD [facility]  CM awaiting ID recommendations for home IV abx.  PT recommends to follow Physicians recommendations, non noted at this time.  CM will continue to follow with needs.   1535: Received a call from Pam with [facility] that she was notified by ID of patient going home with IV abx and [facility] referred for Home health RN. Patient is in agreement. Info on AVS. CM will continue to follow with needs.      Barriers to Discharge: Continued Medical Work up   Patient Goals and CMS Choice Patient states their goals for this hospitalization and ongoing recovery are:: To return home CMS Medicare.gov Compare Post Acute Care list provided to:: Patient Choice offered to / list presented to : Patient  Expected Discharge Plan and Services     Discharge Planning Services: CM Consult   Living arrangements for the past 2 months: Single Family Home                                      Prior Living Arrangements/Services Living arrangements for the past 2 months: Single Family Home Lives with:: Parents Patient language and need for interpreter reviewed:: Yes Do you feel safe going back to the place where you live?: Yes      Need for Family Participation in Patient Care: Yes (Comment) Care  giver support system in place?: Yes (comment)   Criminal Activity/Legal Involvement Pertinent to Current Situation/Hospitalization: No - Comment as needed  Activities of Daily Living      Permission Sought/Granted Permission sought to share information with : Case Manager, Magazine features editor, Family Supports Permission granted to share information with : Yes, Verbal Permission Granted              Emotional Assessment Appearance:: Appears stated age Attitude/Demeanor/Rapport: Engaged, Gracious Affect (typically observed): Accepting, Appropriate, Calm, Hopeful Orientation: : Oriented to Self, Oriented to Place, Oriented to  Time, Oriented to Situation Alcohol / Substance Use: Not Applicable Psych Involvement: No (comment)  Admission diagnosis:  Hyponatremia [E87.1] Knee effusion, left [M25.462] Septic joint of left knee joint (HCC) [M00.9] Acute pain of left knee [M25.562] Patient Active Problem List   Diagnosis Date Noted   Septic joint of left knee joint (HCC) 12/21/2021   Gonorrhea 12/21/2021   Hyponatremia 12/21/2021   Elevated hemoglobin A1c 04/26/2018   Peripheral neuropathy 04/26/2018   Abscess 04/05/2018   Fever    Gluteal abscess    Pilonidal cyst with abscess 04/04/2018   Severe obesity due to excess calories with serious comorbidity and body mass index (BMI) greater than 99th percentile for age in pediatric patient (HCC) 08/16/2017  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/16/2017   Maladaptive health behaviors affecting medical condition 11/02/2016   Morbid obesity (HCC) 08/03/2016   Acanthosis nigricans 08/03/2016   Non compliance w medication regimen    Dehydration    Ketonuria    Hyperglycemia 10/30/2014   Type 2 diabetes mellitus with hyperglycemia, with long-term current use of insulin (HCC) 10/30/2014   Anxiety    PCP:  Christel Mormon, MD [phone] [place], [place] - [address] AT [facility] [address] [place] [address] Phone: [phone] Fax: [phone]     Social Determinants of Health (SDOH) Interventions    Readmission Risk Interventions     No data to display

## 2021-12-22 NOTE — Inpatient Diabetes Management (Addendum)
Inpatient Diabetes Program Recommendations  AACE/ADA: New Consensus Statement on Inpatient Glycemic Control (2015)  Target Ranges:  Prepandial:   less than 140 mg/dL      Peak postprandial:   less than 180 mg/dL (1-2 hours)      Critically ill patients:  140 - 180 mg/dL   Lab Results  Component Value Date   GLUCAP 256 (H) 12/22/2021   HGBA1C 11.5 (A) 04/26/2018    Review of Glycemic Control  Latest Reference Range & Units 12/21/21 07:45 12/21/21 09:48 12/21/21 11:21 12/21/21 14:15 12/21/21 16:44 12/21/21 21:26 12/22/21 07:27  Glucose-Capillary 70 - 99 mg/dL 494 (H) Novolog 15 units 287 (H) Novolog 8 units 228 (H) 140 (H) Novolog 3 units 312 (H) Novolog 15 units 208 (H) Novolog 2 units 256 (H) Novolog 11 units  (H): Data is abnormally high  Diabetes history: DM2 Outpatient Diabetes medications: Tresiba 22 units qd, Metformin 500 mg bid, Not taking per chart Novolog meal coverage Current orders for Inpatient glycemic control: Semglee 10 units bid, Novolog 0-20 units tid, 0-5 units hs  Inpatient Diabetes Program Recommendations:   Please consider: -Increase Semglee to 12 units bid -Add Novolog 4 units tid meal coverage if eats 50% -Decrease Novolog correction to 0-15 units tid , 0-5 units hs  DM coordinator spoke with patient regarding diabetes medications on admission 10/20/21 and diabetes management. Will plan to speak with patient again during this admission.  1:00 Spoke with patient @ bedside. Discussed normal range CBGs fasting and post prandial. Patient was discharged on prescription of Novolog but patient states [facility], Accuchek test strips and supplies. Patient states she called her PCP once, then thought her mom was going to call. Discussed followup needed to assure she has supplies to care for her diabetes and send my chart messages as needed also. Discussed importance of glucose control and risks of elevated CBGs. Patient would  like be ordered libre sensors as well for discharge.  Discharge needs: -CBG supplies accuchek [employer] sensors [phone]  Thank you, Billy Fischer. Mitzie Marlar, RN, MSN, CDE  Diabetes Coordinator Inpatient Glycemic Control Team Team Pager [phone] (8am-5pm) 12/22/2021 10:56 AM

## 2021-12-23 DIAGNOSIS — M009 Pyogenic arthritis, unspecified: Secondary | ICD-10-CM | POA: Diagnosis not present

## 2021-12-23 DIAGNOSIS — M25562 Pain in left knee: Secondary | ICD-10-CM | POA: Diagnosis not present

## 2021-12-23 DIAGNOSIS — E871 Hypo-osmolality and hyponatremia: Secondary | ICD-10-CM | POA: Diagnosis not present

## 2021-12-23 DIAGNOSIS — E876 Hypokalemia: Secondary | ICD-10-CM

## 2021-12-23 DIAGNOSIS — A549 Gonococcal infection, unspecified: Secondary | ICD-10-CM | POA: Diagnosis not present

## 2021-12-23 LAB — BASIC METABOLIC PANEL
Anion gap: 10 (ref 5–15)
BUN: 5 mg/dL — ABNORMAL LOW (ref 6–20)
CO2: 22 mmol/L (ref 22–32)
Calcium: 8.4 mg/dL — ABNORMAL LOW (ref 8.9–10.3)
Chloride: 103 mmol/L (ref 98–111)
Creatinine, Ser: 0.48 mg/dL (ref 0.44–1.00)
GFR, Estimated: >60 mL/min (ref >60)
Glucose, Bld: 202 mg/dL — ABNORMAL HIGH (ref 70–99)
Potassium: 3.4 mmol/L — ABNORMAL LOW (ref 3.5–5.1)
Sodium: 135 mmol/L (ref 135–145)

## 2021-12-23 LAB — GLUCOSE, CAPILLARY
Glucose-Capillary: 234 mg/dL — ABNORMAL HIGH (ref 70–99)
Glucose-Capillary: 223 mg/dL — ABNORMAL HIGH (ref 70–99)
Glucose-Capillary: 155 mg/dL — ABNORMAL HIGH (ref 70–99)
Glucose-Capillary: 196 mg/dL — ABNORMAL HIGH (ref 70–99)

## 2021-12-23 LAB — HEMOGLOBIN A1C
Hgb A1c MFr Bld: 14.7 % — ABNORMAL HIGH (ref 4.8–5.6)
Mean Plasma Glucose: 375.19 mg/dL

## 2021-12-23 LAB — RPR
RPR Ser Ql: REACTIVE — AB
RPR Titer: 1:1 {titer}

## 2021-12-23 MED ORDER — INSULIN ASPART 100 UNIT/ML IJ SOLN: 0.0000 [IU] | Freq: Four times a day (QID) | INTRAMUSCULAR | Status: DC

## 2021-12-23 MED ORDER — BISACODYL 10 MG RE SUPP: 10.0000 mg | Freq: Every day | RECTAL | Status: DC | PRN

## 2021-12-23 MED ORDER — INSULIN GLARGINE-YFGN 100 UNIT/ML ~~LOC~~ SOLN
18.0000 [IU] | Freq: Two times a day (BID) | SUBCUTANEOUS | Status: DC
  Administered 2021-12-23 – 2021-12-27 (×8): 18 [IU] via SUBCUTANEOUS
  Filled 2021-12-23 (×9): qty 0.18

## 2021-12-23 MED ORDER — OXYCODONE HCL 5 MG PO TABS: 5.0000 mg | ORAL_TABLET | ORAL | 0 refills | Status: AC | PRN

## 2021-12-23 MED ORDER — ASPIRIN 81 MG PO CHEW: 81.0000 mg | CHEWABLE_TABLET | Freq: Two times a day (BID) | ORAL | 0 refills | Status: AC

## 2021-12-23 MED ORDER — SENNA 8.6 MG PO TABS: 2.0000 | ORAL_TABLET | Freq: Every evening | ORAL | Status: DC | PRN

## 2021-12-23 MED ORDER — INSULIN ASPART 100 UNIT/ML IJ SOLN: 0.0000 [IU] | Freq: Three times a day (TID) | INTRAMUSCULAR | Status: DC

## 2021-12-23 MED ORDER — POLYETHYLENE GLYCOL 3350 17 G PO PACK
17.0000 g | PACK | Freq: Every day | ORAL | Status: DC
  Administered 2021-12-23 – 2021-12-27 (×5): 17 g via ORAL
  Filled 2021-12-23 (×5): qty 1

## 2021-12-23 MED ORDER — INSULIN ASPART 100 UNIT/ML IJ SOLN
0.0000 [IU] | Freq: Three times a day (TID) | INTRAMUSCULAR | Status: DC
  Administered 2021-12-23 – 2021-12-24 (×2): 3 [IU] via SUBCUTANEOUS
  Administered 2021-12-24: 5 [IU] via SUBCUTANEOUS
  Administered 2021-12-24: 3 [IU] via SUBCUTANEOUS
  Administered 2021-12-25: 2 [IU] via SUBCUTANEOUS
  Administered 2021-12-25: 3 [IU] via SUBCUTANEOUS
  Administered 2021-12-25 – 2021-12-26 (×2): 5 [IU] via SUBCUTANEOUS
  Administered 2021-12-26: 2 [IU] via SUBCUTANEOUS
  Administered 2021-12-26: 3 [IU] via SUBCUTANEOUS
  Administered 2021-12-27: 2 [IU] via SUBCUTANEOUS
  Administered 2021-12-27: 5 [IU] via SUBCUTANEOUS

## 2021-12-23 NOTE — Plan of Care (Signed)

## 2021-12-23 NOTE — Progress Notes (Addendum)
[facility] Progress Note  Patient: Danielle Norman     OIZ:[id number]  DOA: 12/20/2021   PCP: Christel Mormon, MD       Brief hospital course: This is a 21 year old female who has insulin requiring diabetes mellitus, hidradenitis suppurativa who presented to the ED on 7/9 for knee pain after hitting it at work on 7/6.  She is also noted swelling of the knee and increased warmth with difficulty ambulating. Previously seen in the ED 10/20/2021 at which time diagnosed with bacterial vaginosis treated with Flagyl.  Also given doxycycline for treatment of hidradenitis abscess.  Work-up also positive for gonorrhea, negative for chlamydia.  She was seen in follow-up 10/22/2021 and given IM ceftriaxone for treatment of gonorrhea.  Found to have septic arthritis in left knee and underwent I and D on 7/9. Cultures NGTD. On Ceftriaxone. Was having severe pain which appears to be well controlled on narcotics.  Subjective:  Appears angry. States the Oxycodone is only helping a little and she still needs the Morphine IV.  Assessment and Plan: Principal Problem:   Septic joint of left knee joint (HCC) -7/9 > Joint fluid revealed 127,765 WBC, 99% of which were neutrophils - No organisms were seen on the Gram stain -CRP was 34.2 -WBC 13.9> 10.7   -7/9-open arthrotomy and drainage of left knee-synovium sent for aerobic, anaerobic and gonococcal culture- cultures NGTD - she has a drain in the left knee and ortho is following - Currently receiving ceftriaxone  - ID, Dr Thedore Mins, consulted on 7/10 -I have increase morphine from 1 mg to 2 mg every 4 hours as needed for breakthrough and added oxycodone 10 mg every 4 hours (hold if sedated)-- DC Toradol today as RN notes states she was nauseated- laxatives ordered  Active Problems: Gonorrhea -Treated with IM Rocephin on 10/22/2021    Type 2 diabetes mellitus with hyperglycemia, with long-term current use of insulin (HCC)  - I increased Semglee (which  was ordered BID) yesterday- today will increase Semglee from 14 to 18 units twice daily- added 3 units of NovoLog with meals on 7/10- continue sliding scale but lower to moderate dose - A1c in epic was 11.5 in 2019 - A1c today 14.7- she states she was not checking her sugars at home because she did not have strips   Hyponatremia -Sodium 127-likely dehydration secondary to severe hyperglycemia - has improved with IVF- stop today   Hypokalemia - Resolved-   Anemia- probably of chronic disease, normocytic anemia - follow Intermittently     DVT prophylaxis:  enoxaparin (LOVENOX) injection 40 mg Start: 12/22/21 0800 SCDs Start: 12/21/21 1257   Code Status: Full Code  Consultants: Orthopedic surgery, infectious disease Level of Care: Level of care: Med-Surg Disposition Plan:  Status is: Inpatient Remains inpatient appropriate because: Septic right knee joint  Objective:   Vitals:   12/22/21 0825 12/22/21 1706 12/23/21 0240 12/23/21 0957  BP: 133/81 (!) 120/93 121/66 138/85  Pulse: (!) 108 (!) 109 (!) 101 94  Resp: 17 19 18 19   Temp: 99.1 F (37.3 C) 98.3 F (36.8 C) 99.2 F (37.3 C) 98.4 F (36.9 C)  TempSrc: Oral Oral Oral Oral  SpO2: 100% 100% 100% 98%  Weight:      Height:       Filed Weights   12/21/21 0100 12/21/21 1303  Weight: 98.3 kg 97.5 kg   Exam: General exam: Appears comfortable  HEENT: PERRLA, oral mucosa moist, no sclera icterus or thrush Respiratory system: Clear to  auscultation. Respiratory effort normal. Cardiovascular system: S1 & S2 heard, regular rate and rhythm Gastrointestinal system: Abdomen soft, non-tender, nondistended. Normal bowel sounds   Central nervous system: Alert and oriented. No focal neurological deficits. Extremities: No cyanosis, clubbing or edema- left knee in dressing- drain present containing blood tinged fluid Skin: No rashes or ulcers Psychiatry:  Mood & affect appropriate.     Imaging and lab data was personally  reviewed    CBC: Recent Labs  Lab 12/20/21 2255 12/21/21 0440 12/21/21 1416 12/22/21 0031  WBC 13.9* 13.1* 12.0* 10.7*  NEUTROABS 9.0*  --   --  7.3  HGB 12.2 11.7* 11.4* 9.8*  HCT 36.0 34.5* 33.4* 28.9*  MCV 82.6 82.7 82.1 81.9  PLT 267 244 250 242    Basic Metabolic Panel: Recent Labs  Lab 12/20/21 2255 12/21/21 0440 12/21/21 1416 12/22/21 0031  NA 127* 130*  --  132*  K 3.5 3.3*  --  3.7  CL 93* 97*  --  102  CO2 19* 20*  --  20*  GLUCOSE 353* 303*  --  258*  BUN 7 6  --  6  CREATININE 0.58 0.62 0.43* 0.62  CALCIUM 8.9 8.5*  --  8.5*  MG  --   --   --  1.7    GFR: Estimated Creatinine Clearance: 143.1 mL/min (by C-G formula based on SCr of 0.62 mg/dL).  Scheduled Meds:  docusate sodium  100 mg Oral BID   enoxaparin (LOVENOX) injection  40 mg Subcutaneous Q24H   insulin aspart  0-20 Units Subcutaneous TID WC   insulin aspart  0-5 Units Subcutaneous QHS   insulin aspart  3 Units Subcutaneous TID WC   insulin glargine-yfgn  14 Units Subcutaneous BID   oxyCODONE  10 mg Oral Q4H   pantoprazole  40 mg Oral Daily   senna  1 tablet Oral BID   Continuous Infusions:  sodium chloride 100 mL/hr at 12/23/21 1338   cefTRIAXone (ROCEPHIN)  IV 2 g (12/22/21 2241)     LOS: 2 days   Author: Calvert Cantor  12/23/2021 2:20 PM

## 2021-12-23 NOTE — Inpatient Diabetes Management (Signed)
Inpatient Diabetes Program Recommendations  AACE/ADA: New Consensus Statement on Inpatient Glycemic Control (2015)  Target Ranges:  Prepandial:   less than 140 mg/dL      Peak postprandial:   less than 180 mg/dL (1-2 hours)      Critically ill patients:  140 - 180 mg/dL   Lab Results  Component Value Date   GLUCAP 223 (H) 12/23/2021   HGBA1C 14.7 (H) 12/23/2021    Review of Glycemic Control  Latest Reference Range & Units 12/22/21 07:27 12/22/21 12:18 12/22/21 15:47 12/22/21 22:25 12/23/21 07:32 12/23/21 11:05  Glucose-Capillary 70 - 99 mg/dL 287 (H) 681 (H) 157 (H) 203 (H) 234 (H) 223 (H)  (H): Data is abnormally high  Diabetes history: DM2 Outpatient Diabetes medications: Tresiba 22 units qd, Metformin 500 mg bid, Not taking per chart Novolog meal coverage Current orders for Inpatient glycemic control: Semglee 14 units bid, Novolog 3 units tid meal coverage, Novolog 0-20 units tid, 0-5 units hs  Inpatient Diabetes Program Recommendations:   Please consider: -Increase Semglee to 16 units bid -Increase Novolog meal coverage to 4 units tid if eats 50%  Thank you, Danielle Hong E. Vaunda Gutterman, RN, MSN, CDE  Diabetes Coordinator Inpatient Glycemic Control Team Team Pager [phone] (8am-5pm) 12/23/2021 12:46 PM

## 2021-12-23 NOTE — Progress Notes (Signed)
PT Cancellation Note  Patient Details Name: Danielle Norman MRN: [id number] DOB: [birth year]   Cancelled Treatment:    Reason Eval/Treat Not Completed: Pain limiting ability to participate  On arrival, pt returning from bathroom with walker by herself, crying. She had not been able to get help to go to the bathroom and went on her own with much difficulty (linen bag and extra 3n1 in her way, trying to push the IV pole while using RW). Patient very frustrated and asking for pain meds. Assisted pt back to bed and RN in to address pt needs related to pain.    Jerolyn Center, PT Acute Rehabilitation Services  Office [phone]  Zena Amos 12/23/2021, 4:18 PM

## 2021-12-23 NOTE — Consult Note (Signed)
[facility]    Date of Admission:  12/20/2021   Total days of inpatient antibiotics 2        Reason for Consult: Septic arthritis    Principal Problem:   Septic joint of left knee joint ([place]) Active Problems:   Type 2 diabetes mellitus with hyperglycemia, with long-term current use of insulin (HCC)   Gonorrhea   Hyponatremia   Assessment: 21 year old female with history of gonorrhea, chlamydia, syphilis admitted for left knee septic arthritis. 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 Developed swelling. She presented to ED on 7/8 due to chills and worsening pain. -CT left knee showed moderate joint effusion.  Left knee was aspirated noted to have 5 cc of purulent/yellow fluid, Cx+\ -Taken to the OR with Dr. Lyla Norman on 7/9 where patient underwent open arthrotomy and drainage of left knee.  Per OR note there was  copious amount of grossly purulent fluid upon entering the joint.   Recommendations:  -D/C vancomycin -Continue ceftriaxone, will need 4 weeks of IV antibiotics form OR. Received communication form lab that synovial Cx+ neisseriae gonorrhea, doing a secondary test to confirm diagnosis.  -Follow OR and aspirate Cx -Place PICC  #Hx of STI #History of syphilis -RPR 1:16 on 10/30/20  and pt received PENG. 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 RPR on 5/8 1:2 -HIV screen negative on 5/8 -Last sexually acitve one month ago-unprotected Reccs -HIV, GC urine, RPR, HCV -Agreeable to  PREP -Start Truvada -Follow-up in ID clinic  OPAT ORDERS:  Diagnosis: Left knee septic arthritis  Culture Result: Left knee Cx+ Neisseria gonorrhea  No Known Allergies   Discharge antibiotics to be given via  PICC line:  Per pharmacy protocol Ceftriaxone 2gm q24   Duration: 4 weeks End Date: 8/19  [facility] Care Per Protocol with [employer] Use: Home health RN for IV administration and teaching, line care and labs.    Labs weekly while on IV antibiotics: __ CBC with differential __ CMP __ CRP __ ESR   __ Please pull PIC at completion of IV antibiotics   Fax weekly labs to [phone]  Clinic Follow Up Appt: 8/5  @ [facility] with Dr. Candiss Norman   Microbiology:   Antibiotics:  Cefepime 7/8 Ceftriaxone 7/8-p  Vanc 7/8-p Cultures: Blood 7/9 ng Urine  Other OR Cx knee on 7/9-ng, no organisms on gram stain  HPI: Danielle Norman is a 21 y.o. female with type 2 diabetes hidradenitis suppurativa, treated for BV with Flagyl and gonorrhea with IM ceftriaxone 10/22/21 admitted with septic joint of the left knee.  She was at work when noticed knee pain.  On arrival to the ED WBC 13 K but temp of 100.  CT left knee showed moderate joint effusion.  Left knee was aspirated noted to have 5 cc of purulent/yellow fluid, Gram stain did not show organism no growth till date.  Taken to the OR with Dr. Lyla Norman on 7/9 where patient underwent open arthrotomy and drainage of left knee.  Noted to have copious amount of grossly purulent fluid upon entering the joint.  Patient is on vancomycin and ceftriaxone.  ID engaged for antibiotic recommendations.   Review of Systems: Review of Systems  All other systems reviewed  and are negative.   Past Medical History:  Diagnosis Date   Anxiety    Diabetes mellitus, type 2 (HCC)    Diagnosed 10/2014, hbA1c 14.1% at diagnosis.  Started on insulin and metformin   Heart murmur     Social History   Tobacco Use   Smoking status: Never    Passive exposure: Yes   Smokeless tobacco: Never  Vaping Use   Vaping Use: Former   Start date: 04/06/2015   Quit date: 12/04/2017  Substance Use Topics   Alcohol use: No   Drug use: Yes    Types: Marijuana    Comment: pt  states she smokes once a month    Family History  Problem Relation Age of Onset   Kidney disease Father    Diabetes Father        Reported as type 1 diabetes   Diabetes Maternal Aunt        type 2 diabetes, was on oral meds, now diet controlled   Thyroid disease Maternal Grandmother    Scheduled Meds:  docusate sodium  100 mg Oral BID   enoxaparin (LOVENOX) injection  40 mg Subcutaneous Q24H   insulin aspart  0-20 Units Subcutaneous QID   insulin aspart  0-5 Units Subcutaneous QHS   insulin aspart  3 Units Subcutaneous TID WC   insulin glargine-yfgn  14 Units Subcutaneous BID   oxyCODONE  10 mg Oral Q4H   pantoprazole  40 mg Oral Daily   senna  1 tablet Oral BID   Continuous Infusions:  sodium chloride 100 mL/hr at 12/22/21 1817   cefTRIAXone (ROCEPHIN)  IV 2 g (12/22/21 2241)   vancomycin 1,250 mg (12/23/21 0019)   PRN Meds:.acetaminophen, ketorolac, metoCLOPramide **OR** metoCLOPramide (REGLAN) injection, morphine injection, ondansetron **OR** ondansetron (ZOFRAN) IV No Known Allergies  OBJECTIVE: Blood pressure 121/66, pulse (!) 101, temperature 99.2 F (37.3 C), temperature source Oral, resp. rate 18, height 5\' 11"  (1.803 m), weight 97.5 kg, last menstrual period 12/06/2021, SpO2 100 %.  Physical Exam Constitutional:      Appearance: Normal appearance.  HENT:     Head: Normocephalic and atraumatic.     Right Ear: Tympanic membrane normal.     Left Ear: Tympanic membrane normal.     Nose: Nose normal.     Mouth/Throat:     Mouth: Mucous membranes are moist.  Eyes:     Extraocular Movements: Extraocular movements intact.     Conjunctiva/sclera: Conjunctivae normal.     Pupils: Pupils are equal, round, and reactive to light.  Cardiovascular:     Rate and Rhythm: Normal rate and regular rhythm.     Heart sounds: No murmur heard.    No friction rub. No gallop.  Pulmonary:     Effort: Pulmonary effort is normal.     Breath sounds: Normal breath sounds.   Abdominal:     General: Abdomen is flat.     Palpations: Abdomen is soft.  Musculoskeletal:        General: Normal range of motion.     Comments: Left knee bandaged  Skin:    General: Skin is warm and dry.  Neurological:     General: No focal deficit present.     Mental Status: She is alert and oriented to person, place, and time.  Psychiatric:        Mood and Affect: Mood normal.     Lab Results Lab Results  Component Value Date   WBC 10.7 (H) 12/22/2021  HGB 9.8 (L) 12/22/2021   HCT 28.9 (L) 12/22/2021   MCV 81.9 12/22/2021   PLT 242 12/22/2021    Lab Results  Component Value Date   CREATININE 0.62 12/22/2021   BUN 6 12/22/2021   NA 132 (L) 12/22/2021   K 3.7 12/22/2021   CL 102 12/22/2021   CO2 20 (L) 12/22/2021    Lab Results  Component Value Date   ALT 15 12/20/2021   AST 12 (L) 12/20/2021   ALKPHOS 104 12/20/2021   BILITOT 0.9 12/20/2021       Laurice Record, [facility] 12/23/2021, 7:39 AM

## 2021-12-23 NOTE — Progress Notes (Signed)
Subjective:  Patient reports pain as moderate.  Denies N/V/CP/SOB/Abd pain. She denies fever and chills. Temp 99.2 this morning. She states that her pain was improved with the change in her medication yesterday. She is still having a lot of pain. She did work well with PT yesterday but she states it was painful. She denies tingling and numbness in LE bilaterally.   She has mild pedal edema left lower extremity. We discussed to continue ice and elevation with pillow under heel instead of knee. She understands this morning.   Diabetes management saw patient yesterday for optimal glucose control. Her A1C is 14.7 today. Her glucose has been in the 200s-300s during stay.   Objective:   VITALS:   Vitals:   12/22/21 0519 12/22/21 0825 12/22/21 1706 12/23/21 0240  BP: 132/77 133/81 (!) 120/93 121/66  Pulse: (!) 116 (!) 108 (!) 109 (!) 101  Resp: 18 17 19 18   Temp: 98.9 F (37.2 C) 99.1 F (37.3 C) 98.3 F (36.8 C) 99.2 F (37.3 C)  TempSrc:  Oral Oral Oral  SpO2: 100% 100% 100% 100%  Weight:      Height:        Patient lying in bed this morning. NAD.  Neurologically intact ABD soft Neurovascular intact Sensation intact distally Intact pulses distally Dorsiflexion/Plantar flexion intact Incision: dressing C/D/I No cellulitis present Compartment soft Mild pedal edema left lower extremity. Continue ice and elevation with pillow under heel instead of knee.   Lab Results  Component Value Date   WBC 10.7 (H) 12/22/2021   HGB 9.8 (L) 12/22/2021   HCT 28.9 (L) 12/22/2021   MCV 81.9 12/22/2021   PLT 242 12/22/2021   BMET    Component Value Date/Time   NA 132 (L) 12/22/2021 0031   K 3.7 12/22/2021 0031   CL 102 12/22/2021 0031   CO2 20 (L) 12/22/2021 0031   GLUCOSE 258 (H) 12/22/2021 0031   BUN 6 12/22/2021 0031   CREATININE 0.62 12/22/2021 0031   CALCIUM 8.5 (L) 12/22/2021 0031   GFRNONAA >60 12/22/2021 0031   Results for orders placed or performed during the hospital  encounter of 12/20/21  Body fluid culture w Gram Stain     Status: None (Preliminary result)   Collection Time: 12/21/21 12:22 AM   Specimen: KNEE; Body Fluid  Result Value Ref Range Status   Specimen Description KNEE  Final   Special Requests NONE  Final   Gram Stain   Final    MODERATE WBC PRESENT,BOTH PMN AND MONONUCLEAR NO ORGANISMS SEEN    Culture   Final    NO GROWTH 1 DAY Performed at [facility], [address] [place], [place]    Report Status PENDING  Incomplete  Culture, blood (Routine X 2) w Reflex to ID Panel     Status: None (Preliminary result)   Collection Time: 12/21/21  5:00 AM   Specimen: BLOOD  Result Value Ref Range Status   Specimen Description BLOOD RIGHT ANTECUBITAL  Final   Special Requests   Final    BOTTLES DRAWN AEROBIC AND ANAEROBIC Blood Culture adequate volume   Culture   Final    NO GROWTH 2 DAYS Performed at [facility], [address], [place], [place]    Report Status PENDING  Incomplete  Aerobic/Anaerobic Culture w Gram Stain (surgical/deep wound)     Status: None (Preliminary result)   Collection Time: 12/21/21 10:43 AM   Specimen: Synovium; Tissue  Result Value Ref Range Status   Specimen Description TISSUE  Final   Special Requests LEFT KNEE SYNOVIUM  Final   Gram Stain   Final    RARE WBC PRESENT,BOTH PMN AND MONONUCLEAR NO ORGANISMS SEEN    Culture   Final    NO GROWTH 2 DAYS Performed at [facility], [address] [place], [place] [address]    Report Status PENDING  Incomplete  Culture, blood (Routine X 2) w Reflex to ID Panel     Status: None (Preliminary result)   Collection Time: 12/21/21  2:16 PM   Specimen: BLOOD  Result Value Ref Range Status   Specimen Description BLOOD SITE NOT SPECIFIED  Final   Special Requests   Final    BOTTLES DRAWN AEROBIC ONLY Blood Culture results may not be optimal due to an inadequate volume of blood received in culture bottles   Culture   Final     NO GROWTH 2 DAYS Performed at [facility], [address] [place], [place] [address]    Report Status PENDING  Incomplete     Assessment/Plan: 2 Days Post-Op   Principal Problem:   Septic joint of left knee joint (HCC) Active Problems:   Type 2 diabetes mellitus with hyperglycemia, with long-term current use of insulin (HCC)   Gonorrhea   Hyponatremia  Synovial fluid culture and tissue culture results still pending. Continue broad spectrum antibiotics ceftriaxone and vancomycin.   Continue hemovac drain and record amount in drain.   Continue glucose control with goal under 200 for optimal wound healing.   WBAT with walker/crutches DVT ppx: Lovenox, SCDs, TEDS PO pain control PT/OT: Patient ambulated 105 feet with PT yesterday. Continue PT today.  Dispo:Disposition per medical team. Continue hemovac drain until less than 30cc of drainage per shift. Drain will need to be removed prior to discharge. Continue to follow cultures. Continue IV antibiotics. PICC line and Infectious disease consult for IV antibiotic selection and duration. Cryotherapy for knee as needed for pain and swelling. Pain medication and DVT ppx printed in chart.     Clois Dupes, PA-C 12/23/2021, 8:00 AM  [facility]  [place] [address], [address], [place], [place] [address]

## 2021-12-23 NOTE — Progress Notes (Signed)
Mobility Specialist Criteria Algorithm Info.   12/23/21 1200  Mobility  Activity Ambulated with assistance in hallway  Range of Motion/Exercises Active;All extremities  Level of Assistance Standby assist, set-up cues, supervision of patient - no hands on  Assistive Device Front wheel walker  Distance Ambulated (ft) 180 ft  Activity Response Tolerated well   Patient received in supine agreeable to participate in mobility. Ambulated in hallway supervision level with slow gait. Returned to room without incident. Was left in recliner with all needs met, call bell in reach.   12/23/2021 3:04 PM  Danielle Norman, [place], [place]  XLLIY:[phone] Office: [phone]

## 2021-12-24 ENCOUNTER — Inpatient Hospital Stay: Payer: Self-pay

## 2021-12-24 DIAGNOSIS — M00862 Arthritis due to other bacteria, left knee: Secondary | ICD-10-CM | POA: Diagnosis not present

## 2021-12-24 LAB — HEPATITIS C ANTIBODY: HCV Ab: NONREACTIVE

## 2021-12-24 LAB — CBC
HCT: 27.7 % — ABNORMAL LOW (ref 36.0–46.0)
Hemoglobin: 9.6 g/dL — ABNORMAL LOW (ref 12.0–15.0)
MCH: 28.5 pg (ref 26.0–34.0)
MCHC: 34.7 g/dL (ref 30.0–36.0)
MCV: 82.2 fL (ref 80.0–100.0)
Platelets: 325 10*3/uL (ref 150–400)
RBC: 3.37 MIL/uL — ABNORMAL LOW (ref 3.87–5.11)
RDW: 12 % (ref 11.5–15.5)
WBC: 7.9 10*3/uL (ref 4.0–10.5)
nRBC: 0 % (ref 0.0–0.2)

## 2021-12-24 LAB — BASIC METABOLIC PANEL
Anion gap: 13 (ref 5–15)
BUN: <5 mg/dL — ABNORMAL LOW (ref 6–20)
CO2: 25 mmol/L (ref 22–32)
Calcium: 8.9 mg/dL (ref 8.9–10.3)
Chloride: 101 mmol/L (ref 98–111)
Creatinine, Ser: 0.35 mg/dL — ABNORMAL LOW (ref 0.44–1.00)
GFR, Estimated: >60 mL/min (ref >60)
Glucose, Bld: 197 mg/dL — ABNORMAL HIGH (ref 70–99)
Potassium: 3.4 mmol/L — ABNORMAL LOW (ref 3.5–5.1)
Sodium: 139 mmol/L (ref 135–145)

## 2021-12-24 LAB — HIV-1 RNA QUANT-NO REFLEX-BLD
HIV 1 RNA Quant: <20 copies/mL
LOG10 HIV-1 RNA: UNDETERMINED log10copy/mL

## 2021-12-24 LAB — GLUCOSE, CAPILLARY
Glucose-Capillary: 190 mg/dL — ABNORMAL HIGH (ref 70–99)
Glucose-Capillary: 226 mg/dL — ABNORMAL HIGH (ref 70–99)
Glucose-Capillary: 179 mg/dL — ABNORMAL HIGH (ref 70–99)
Glucose-Capillary: 225 mg/dL — ABNORMAL HIGH (ref 70–99)

## 2021-12-24 LAB — MAGNESIUM: Magnesium: 1.8 mg/dL (ref 1.7–2.4)

## 2021-12-24 LAB — T.PALLIDUM AB, TOTAL: T Pallidum Abs: REACTIVE — AB

## 2021-12-24 MED ORDER — INSULIN ASPART 100 UNIT/ML IJ SOLN
4.0000 [IU] | Freq: Three times a day (TID) | INTRAMUSCULAR | Status: DC
Start: 2021-12-24 — End: 2021-12-27
  Administered 2021-12-24 – 2021-12-27 (×9): 4 [IU] via SUBCUTANEOUS

## 2021-12-24 MED ORDER — SODIUM CHLORIDE 0.9% FLUSH
10.0000 mL | Freq: Two times a day (BID) | INTRAVENOUS | Status: DC
  Administered 2021-12-24 – 2021-12-26 (×3): 10 mL

## 2021-12-24 MED ORDER — SODIUM CHLORIDE 0.9% FLUSH
10.0000 mL | INTRAVENOUS | Status: DC | PRN
  Administered 2021-12-27: 10 mL

## 2021-12-24 MED ORDER — POTASSIUM CHLORIDE CRYS ER 20 MEQ PO TBCR
40.0000 meq | EXTENDED_RELEASE_TABLET | Freq: Once | ORAL | Status: AC
  Administered 2021-12-24: 40 meq via ORAL
  Filled 2021-12-24: qty 2

## 2021-12-24 MED ORDER — CHLORHEXIDINE GLUCONATE CLOTH 2 % EX PADS
6.0000 | MEDICATED_PAD | Freq: Every day | CUTANEOUS | Status: DC
  Administered 2021-12-24 – 2021-12-26 (×3): 6 via TOPICAL

## 2021-12-24 MED ORDER — EMTRICITABINE-TENOFOVIR DF 200-300 MG PO TABS
1.0000 | ORAL_TABLET | Freq: Every day | ORAL | Status: DC
Start: 2021-12-25 — End: 2021-12-27
  Administered 2021-12-25 – 2021-12-27 (×3): 1 via ORAL
  Filled 2021-12-24 (×3): qty 1

## 2021-12-24 NOTE — Progress Notes (Signed)
Physical Therapy Treatment Patient Details Name: Danielle Norman MRN: [id number] DOB: [birth year] Today's Date: 12/24/2021   History of Present Illness 21 y.o. female who presented to the ED 12/20/21 for evaluation of left knee pain. Left knee x-ray showed suprapatellar joint effusion.  No fracture or dislocation identified. +septic arthritis; +aspiration; 7/9 Open arthrotomy and drainage left knee.  PMH significant for insulin-dependent type 2 diabetes, hidradenitis suppurativa    PT Comments    Goal to complete stair training today; patient reported ability to walk to nurses station and back. We attempted this but fell short due to significant pain when attempting to ambulate in hallway. Stair training deferred. RN notified of increased pain, pt requesting pain meds. Ice applied to Lt knee, repositioned in reclining chair. Encouraged A/AROM for Rt knee and ankle as tolerated. Patient will continue to benefit from skilled physical therapy services to further improve independence with functional mobility.    Recommendations for follow up therapy are one component of a multi-disciplinary discharge planning process, led by the attending physician.  Recommendations may be updated based on patient status, additional functional criteria and insurance authorization.  Follow Up Recommendations  Follow physician's recommendations for discharge plan and follow up therapies (OPPT when MD clears patient)     Assistance Recommended at Discharge PRN  Patient can return home with the following Help with stairs or ramp for entrance   Equipment Recommendations  Rolling walker (2 wheels);BSC/3in1    Recommendations for Other Services       Precautions / Restrictions Precautions Precautions: Knee Precaution Booklet Issued: No Precaution Comments: Educated not to put a pillow under her knee Restrictions Weight Bearing Restrictions: No     Mobility  Bed Mobility Overal bed mobility: Needs  Assistance Bed Mobility: Supine to Sit     Supine to sit: Min assist     General bed mobility comments: Pt required min assist for LLE support to bring out of bed and lower.    Transfers Overall transfer level: Needs assistance Equipment used: Rolling walker (2 wheels) Transfers: Sit to/from Stand Sit to Stand: Supervision           General transfer comment: no cues needed, difficulty sitting back down with Lt knee bending    Ambulation/Gait Ambulation/Gait assistance: Supervision Gait Distance (Feet): 15 Feet Assistive device: Rolling walker (2 wheels) Gait Pattern/deviations: Decreased stride length, Decreased weight shift to left, Step-through pattern, Knee flexed in stance - left Gait velocity: very slow     General Gait Details: Cues for gait symmetry, minimal Lt knee flexion, putting very little weight on LLE. Only made a short distance before patient needed to sit 2/2 pain.   Stairs Stairs:  (Deferred 2/2 significant pain attempting amb today.)           Wheelchair Mobility    Modified Rankin (Stroke Patients Only)       Balance Overall balance assessment: Needs assistance Sitting-balance support: No upper extremity supported, Feet supported Sitting balance-Leahy Scale: Normal     Standing balance support: Bilateral upper extremity supported, Reliant on assistive device for balance Standing balance-Leahy Scale: Poor Standing balance comment: due to pain, self-limiting wb'g on LLE                            Cognition Arousal/Alertness: Awake/alert Behavior During Therapy: WFL for tasks assessed/performed Overall Cognitive Status: Within Functional Limits for tasks assessed  Exercises Other Exercises Other Exercises: encouraged AROM ankle and knee;    General Comments General comments (skin integrity, edema, etc.): In good spirits prior to session, willing to walk to  stairwell and try navigating. Pt made very short distance and had to sit due to pain. Tearful. RN notified requesting pain meds.      Pertinent Vitals/Pain Pain Assessment Pain Assessment: 0-10 Pain Score: 5  (Pre activity - 10/10 post amb.) Pain Location: left knee Pain Descriptors / Indicators: Constant, Grimacing, Guarding Pain Intervention(s): Limited activity within patient's tolerance, Monitored during session, Repositioned, Patient requesting pain meds-RN notified, Ice applied    Home Living                          Prior Function            PT Goals (current goals can now be found in the care plan section) Acute Rehab PT Goals Patient Stated Goal: none stated; focused on pain PT Goal Formulation: With patient Time For Goal Achievement: 01/04/22 Potential to Achieve Goals: Good Progress towards PT goals: Progressing toward goals    Frequency    Min 5X/week      PT Plan Current plan remains appropriate    Co-evaluation              AM-PAC PT "6 Clicks" Mobility   Outcome Measure  Help needed turning from your back to your side while in a flat bed without using bedrails?: None Help needed moving from lying on your back to sitting on the side of a flat bed without using bedrails?: A Little Help needed moving to and from a bed to a chair (including a wheelchair)?: A Little Help needed standing up from a chair using your arms (e.g., wheelchair or bedside chair)?: A Little Help needed to walk in hospital room?: A Little Help needed climbing 3-5 steps with a railing? : A Lot 6 Click Score: 18    End of Session Equipment Utilized During Treatment: Gait belt Activity Tolerance: Patient limited by pain Patient left: with call bell/phone within reach;in chair (Refused elevation at this time due to pain. Ice placed) Nurse Communication: Mobility status;Patient requests pain meds PT Visit Diagnosis: Difficulty in walking, not elsewhere classified  (R26.2)     Time: 8101-7510 PT Time Calculation (min) (ACUTE ONLY): 19 min  Charges:  $Gait Training: 8-22 mins                     Kathlyn Sacramento, PT     [place] 12/24/2021, 11:43 AM

## 2021-12-24 NOTE — Progress Notes (Signed)
PROGRESS NOTE    Danielle Norman  PPJ:[id number] DOB: Jun 22, 2000 DOA: 12/20/2021 PCP: Christel Mormon, MD     Brief Narrative:  Danielle Norman is a 21 year old female who has insulin requiring diabetes mellitus, hidradenitis suppurativa who presented to the ED on 7/9 for knee pain after hitting it at work on 7/6.  She is also noted swelling of the knee and increased warmth with difficulty ambulating.   Previously seen in the ED 10/20/2021 at which time diagnosed with bacterial vaginosis treated with Flagyl.  Also given doxycycline for treatment of hidradenitis abscess.  Work-up also positive for gonorrhea, negative for chlamydia.  She was seen in follow-up 10/22/2021 and given IM ceftriaxone for treatment of gonorrhea.  This admission, she was found to have septic arthritis in left knee and underwent I&D on 7/9. Cultures NGTD. On Ceftriaxone.   New events last 24 hours / Subjective: Patient continues to have pain.  Discussed reasons for PICC line recommendation by infectious disease.  She is agreeable.  Assessment & Plan:   Principal Problem:   Septic joint of left knee joint (HCC) Active Problems:   Type 2 diabetes mellitus with hyperglycemia, with long-term current use of insulin (HCC)   h/o Gonorrhea   Hyponatremia   Hypokalemia   Septic joint of the left knee -Status post open arthrotomy and drainage of left knee 7/9.  Fluid culture shows Neisseria gonorrhea.  Drain in place - "Continue hemovac drain until less than 30cc of drainage per shift. Drain will need to be removed prior to discharge." -Continue ceftriaxone.  PICC line ordered today -Infectious disease following, recommended IV antibiotics, end date 8/19 -Has follow-up with Dr. Thedore Mins 7/25  Diabetes mellitus type 2 with hyperglycemia -A1c 14.7 -Semglee, NovoLog, sliding scale  History of gonorrhea History of syphilis -Treated with IM Rocephin 10/22/2021 -Follow-up with infectious  disease  Hypokalemia -Replace   DVT prophylaxis:  enoxaparin (LOVENOX) injection 40 mg Start: 12/22/21 0800 SCDs Start: 12/21/21 1257  Code Status: Full Family Communication: None at bedside  Disposition Plan:  Status is: Inpatient Remains inpatient appropriate because: Pain control   Antimicrobials:  Anti-infectives (From admission, onward)    Start     Dose/Rate Route Frequency Ordered Stop   12/21/21 0924  ceFAZolin (ANCEF) 2-4 GM/100ML-% IVPB       Note to Pharmacy: Garen Lah E: cabinet override      12/21/21 0924 12/21/21 2129   12/21/21 0915  ceFAZolin (ANCEF) IVPB 2g/100 mL premix  Status:  Discontinued        2 g 200 mL/hr over 30 Minutes Intravenous On call to O.R. 12/21/21 0905 12/21/21 1248   12/21/21 0800  vancomycin (VANCOREADY) IVPB 1250 mg/250 mL  Status:  Discontinued        1,250 mg 166.7 mL/hr over 90 Minutes Intravenous Every 8 hours 12/21/21 0125 12/23/21 1136   12/21/21 0130  cefTRIAXone (ROCEPHIN) 2 g in sodium chloride 0.9 % 100 mL IVPB        2 g 200 mL/hr over 30 Minutes Intravenous Daily at 10 pm 12/21/21 0118     12/21/21 0030  ceFEPIme (MAXIPIME) 2 g in sodium chloride 0.9 % 100 mL IVPB  Status:  Discontinued        2 g 200 mL/hr over 30 Minutes Intravenous Every 8 hours 12/21/21 0027 12/21/21 0118   12/21/21 0030  vancomycin (VANCOREADY) IVPB 2000 mg/400 mL        2,000 mg 200 mL/hr over 120 Minutes Intravenous  Once 12/21/21 0027 12/21/21 0424        Objective: Vitals:   12/23/21 1705 12/23/21 2308 12/24/21 0637 12/24/21 0910  BP: 140/77 125/80 127/71 (!) 128/96  Pulse: (!) 103 (!) 105 91 (!) 105  Resp: 19 18 16 18   Temp: 98.4 F (36.9 C) 99.9 F (37.7 C) 98.3 F (36.8 C) 98.7 F (37.1 C)  TempSrc: Oral  Oral Oral  SpO2: 100% 100% 100% 100%  Weight:      Height:        Intake/Output Summary (Last 24 hours) at 12/24/2021 1233 Last data filed at 12/24/2021 0600 Gross per 24 hour  Intake 780 ml  Output 65 ml  Net 715 ml    Filed Weights   12/21/21 0100 12/21/21 1303  Weight: 98.3 kg 97.5 kg    Examination:  General exam: Appears calm and comfortable  Respiratory system: Clear to auscultation. Respiratory effort normal. No respiratory distress. No conversational dyspnea.  Cardiovascular system: S1 & S2 heard, RRR. No murmurs. No pedal edema. Gastrointestinal system: Abdomen is nondistended, soft and nontender. Normal bowel sounds heard. Central nervous system: Alert and oriented. No focal neurological deficits. Speech clear.  Extremities: Left lower extremity with Hemovac drain in place, dressing in place. Psychiatry: Mood & affect stable.   Data Reviewed: I have personally reviewed following labs and imaging studies  CBC: Recent Labs  Lab 12/20/21 2255 12/21/21 0440 12/21/21 1416 12/22/21 0031 12/24/21 0802  WBC 13.9* 13.1* 12.0* 10.7* 7.9  NEUTROABS 9.0*  --   --  7.3  --   HGB 12.2 11.7* 11.4* 9.8* 9.6*  HCT 36.0 34.5* 33.4* 28.9* 27.7*  MCV 82.6 82.7 82.1 81.9 82.2  PLT 267 244 250 242 325   Basic Metabolic Panel: Recent Labs  Lab 12/20/21 2255 12/21/21 0440 12/21/21 1416 12/22/21 0031 12/23/21 0345 12/24/21 0802  NA 127* 130*  --  132* 135 139  K 3.5 3.3*  --  3.7 3.4* 3.4*  CL 93* 97*  --  102 103 101  CO2 19* 20*  --  20* 22 25  GLUCOSE 353* 303*  --  258* 202* 197*  BUN 7 6  --  6 5* <5*  CREATININE 0.58 0.62 0.43* 0.62 0.48 0.35*  CALCIUM 8.9 8.5*  --  8.5* 8.4* 8.9  MG  --   --   --  1.7  --  1.8   GFR: Estimated Creatinine Clearance: 143.1 mL/min (A) (by C-G formula based on SCr of 0.35 mg/dL (L)). Liver Function Tests: Recent Labs  Lab 12/20/21 2255  AST 12*  ALT 15  ALKPHOS 104  BILITOT 0.9  PROT 7.6  ALBUMIN 3.2*   No results for input(s): "LIPASE", "AMYLASE" in the last 168 hours. No results for input(s): "AMMONIA" in the last 168 hours. Coagulation Profile: No results for input(s): "INR", "PROTIME" in the last 168 hours. Cardiac Enzymes: No results  for input(s): "CKTOTAL", "CKMB", "CKMBINDEX", "TROPONINI" in the last 168 hours. BNP (last 3 results) No results for input(s): "PROBNP" in the last 8760 hours. HbA1C: Recent Labs    12/23/21 0345  HGBA1C 14.7*   CBG: Recent Labs  Lab 12/23/21 1105 12/23/21 1626 12/23/21 2302 12/24/21 0741 12/24/21 1142  GLUCAP 223* 155* 196* 190* 226*   Lipid Profile: No results for input(s): "CHOL", "HDL", "LDLCALC", "TRIG", "CHOLHDL", "LDLDIRECT" in the last 72 hours. Thyroid Function Tests: No results for input(s): "TSH", "T4TOTAL", "FREET4", "T3FREE", "THYROIDAB" in the last 72 hours. Anemia Panel: No results for input(s): "  VITAMINB12", "FOLATE", "FERRITIN", "TIBC", "IRON", "RETICCTPCT" in the last 72 hours. Sepsis Labs: No results for input(s): "PROCALCITON", "LATICACIDVEN" in the last 168 hours.  Recent Results (from the past 240 hour(s))  Body fluid culture w Gram Stain     Status: None (Preliminary result)   Collection Time: 12/21/21 12:22 AM   Specimen: KNEE; Body Fluid  Result Value Ref Range Status   Specimen Description KNEE  Final   Special Requests NONE  Final   Gram Stain   Final    MODERATE WBC PRESENT,BOTH PMN AND MONONUCLEAR NO ORGANISMS SEEN    Culture   Final    RARE NEISSERIA GONORRHOEAE BETA LACTAMASE POSITIVE HEALTH DEPARTMENT NOTIFIED Performed at [facility], [address] [place], [place] [address]    Report Status PENDING  Incomplete  Culture, blood (Routine X 2) w Reflex to ID Panel     Status: None (Preliminary result)   Collection Time: 12/21/21  5:00 AM   Specimen: BLOOD  Result Value Ref Range Status   Specimen Description BLOOD RIGHT ANTECUBITAL  Final   Special Requests   Final    BOTTLES DRAWN AEROBIC AND ANAEROBIC Blood Culture adequate volume   Culture   Final    NO GROWTH 3 DAYS Performed at [facility], [address] [place], [place] [address]    Report Status PENDING  Incomplete  Aerobic/Anaerobic Culture w Gram Stain  (surgical/deep wound)     Status: None (Preliminary result)   Collection Time: 12/21/21 10:43 AM   Specimen: Synovium; Tissue  Result Value Ref Range Status   Specimen Description TISSUE  Final   Special Requests LEFT KNEE SYNOVIUM  Final   Gram Stain   Final    RARE WBC PRESENT,BOTH PMN AND MONONUCLEAR NO ORGANISMS SEEN    Culture   Final    NO GROWTH 3 DAYS Performed at [facility], [address], [place], [place] [address]    Report Status PENDING  Incomplete  Culture, blood (Routine X 2) w Reflex to ID Panel     Status: None (Preliminary result)   Collection Time: 12/21/21  2:16 PM   Specimen: BLOOD  Result Value Ref Range Status   Specimen Description BLOOD SITE NOT SPECIFIED  Final   Special Requests   Final    BOTTLES DRAWN AEROBIC ONLY Blood Culture results may not be optimal due to an inadequate volume of blood received in culture bottles   Culture   Final    NO GROWTH 3 DAYS Performed at [facility], [address], [place], [place] [address]    Report Status PENDING  Incomplete      Radiology Studies: [place] EKG SITE RITE  Result Date: 12/24/2021 If Site Rite image not attached, placement could not be confirmed due to current cardiac rhythm.     Scheduled Meds:  docusate sodium  100 mg Oral BID   enoxaparin (LOVENOX) injection  40 mg Subcutaneous Q24H   insulin aspart  0-15 Units Subcutaneous TID WC   insulin aspart  0-5 Units Subcutaneous QHS   insulin aspart  3 Units Subcutaneous TID WC   insulin glargine-yfgn  18 Units Subcutaneous BID   oxyCODONE  10 mg Oral Q4H   pantoprazole  40 mg Oral Daily   polyethylene glycol  17 g Oral Daily   senna  1 tablet Oral BID   Continuous Infusions:  cefTRIAXone (ROCEPHIN)  IV 2 g (12/23/21 2306)     LOS:  3 days     Noralee Stain, DO [facility] 12/24/2021, 12:33 PM   Available via Epic secure chat 7am-7pm After these hours, please refer to coverage provider listed on [email or web address]

## 2021-12-24 NOTE — Progress Notes (Signed)
[facility]  Date of Admission:  12/20/2021   Total days of inpatient antibiotics 3  Principal Problem:   Septic joint of left knee joint (HCC) Active Problems:   Type 2 diabetes mellitus with hyperglycemia, with long-term current use of insulin (HCC)   h/o Gonorrhea   Hyponatremia   Hypokalemia          Assessment: 21 year old female with history of gonorrhea, chlamydia, syphilis admitted for left knee septic arthritis. 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 Developed swelling. She presented to ED on 7/8 due to chills and worsening pain. -CT left knee showed moderate joint effusion.  Left knee was aspirated noted to have 5 cc of purulent/yellow fluid, Cx+ Neisseria gonorrhea, HD notified) -Taken to the OR with Dr. Linna Caprice on 7/9 where patient underwent open arthrotomy and drainage of left knee, Cx NGx3d.  Per OR note there was  copious amount of grossly purulent fluid upon entering the joint.    Recommendations:  -Continue ceftriaxone, will need 4 weeks of IV antibiotics from OR.  -Follow OR cx -OPAT ordered in place   #Hx of STI #History of syphilis -RPR 1:16 on 10/30/20  and pt received PENG. 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 RPR on 5/8 1:2 -HIV screen negative on 5/8 -Last sexually acitve one month ago-unprotected -RPR 1:1, c/w previously treated syphilis Reccs -F/U GC urine, HCV -Agreeable to  PREP -Start Truvada once HIV returns negative -Follow-up in ID clinic on 8/5   ID will sign off, please engage with any question or concerns.   Microbiology:   Antibiotics:   Cefepime 7/8 Ceftriaxone 7/8-p  Vanc 7/8-7/11 Cultures: Blood 7/9  ng Urine   SUBJECTIVE: Resitn in bed. No new complaints  Review of Systems: Review of Systems  All other systems reviewed and are negative.    Scheduled Meds:  Chlorhexidine Gluconate Cloth  6 each Topical Daily   docusate sodium  100 mg Oral BID   enoxaparin (LOVENOX) injection  40 mg Subcutaneous Q24H   insulin aspart  0-15 Units Subcutaneous TID WC   insulin aspart  0-5 Units Subcutaneous QHS   insulin aspart  4 Units Subcutaneous TID WC   insulin glargine-yfgn  18 Units Subcutaneous BID   oxyCODONE  10 mg Oral Q4H   pantoprazole  40 mg Oral Daily   polyethylene glycol  17 g Oral Daily   senna  1 tablet Oral BID   sodium chloride flush  10-40 mL Intracatheter Q12H   Continuous Infusions:  cefTRIAXone (ROCEPHIN)  IV 2 g (12/23/21 2306)   PRN Meds:.acetaminophen, bisacodyl, metoCLOPramide **OR** metoCLOPramide (REGLAN) injection, morphine injection, ondansetron **OR** ondansetron (ZOFRAN) IV, sodium chloride flush No Known Allergies  OBJECTIVE: Vitals:   12/23/21 1705 12/23/21 2308 12/24/21 0637 12/24/21 0910  BP: 140/77 125/80 127/71 (!) 128/96  Pulse: (!) 103 (!) 105 91 (!) 105  Resp: 19 18 16 18   Temp: 98.4 F (36.9 C) 99.9 F (37.7 C) 98.3 F (36.8 C) 98.7 F (37.1 C)  TempSrc: Oral  Oral Oral  SpO2: 100% 100% 100% 100%  Weight:      Height:       Body mass index is 29.98 kg/m.  Physical Exam Constitutional:  Appearance: Normal appearance.  HENT:     Head: Normocephalic and atraumatic.     Right Ear: Tympanic membrane normal.     Left Ear: Tympanic membrane normal.     Nose: Nose normal.     Mouth/Throat:     Mouth: Mucous membranes are moist.  Eyes:     Extraocular Movements: Extraocular movements intact.     Conjunctiva/sclera: Conjunctivae normal.     Pupils: Pupils are equal, round, and reactive to light.  Cardiovascular:     Rate and Rhythm: Normal rate and regular rhythm.     Heart sounds: No murmur heard.    No friction rub. No  gallop.  Pulmonary:     Effort: Pulmonary effort is normal.     Breath sounds: Normal breath sounds.  Abdominal:     General: Abdomen is flat.     Palpations: Abdomen is soft.  Musculoskeletal:        General: Normal range of motion.     Comments: LLE wound Vac  Skin:    General: Skin is warm and dry.  Neurological:     General: No focal deficit present.     Mental Status: She is alert and oriented to person, place, and time.  Psychiatric:        Mood and Affect: Mood normal.       Lab Results Lab Results  Component Value Date   WBC 7.9 12/24/2021   HGB 9.6 (L) 12/24/2021   HCT 27.7 (L) 12/24/2021   MCV 82.2 12/24/2021   PLT 325 12/24/2021    Lab Results  Component Value Date   CREATININE 0.35 (L) 12/24/2021   BUN <5 (L) 12/24/2021   NA 139 12/24/2021   K 3.4 (L) 12/24/2021   CL 101 12/24/2021   CO2 25 12/24/2021    Lab Results  Component Value Date   ALT 15 12/20/2021   AST 12 (L) 12/20/2021   ALKPHOS 104 12/20/2021   BILITOT 0.9 12/20/2021        Danelle Earthly, MD [facility] 12/24/2021, 2:08 PM

## 2021-12-24 NOTE — Progress Notes (Signed)
PHARMACY CONSULT NOTE FOR:  OUTPATIENT  PARENTERAL ANTIBIOTIC THERAPY (OPAT)  Indication: Gonococcal septic arthritis Regimen: Ceftriaxone 2 gm IV Q 24 hours  End date: 01/18/2022  IV antibiotic discharge orders are pended. To discharging provider:  please sign these orders via discharge navigator,  Select New Orders & click on the button choice - Manage This Unsigned Work.     Thank you for allowing pharmacy to be a part of this patient's care.  Sharin Mons, PharmD, BCPS, BCIDP Infectious Diseases Clinical Pharmacist Phone: [phone] 12/24/2021, 1:23 PM

## 2021-12-24 NOTE — Progress Notes (Signed)
Peripherally Inserted Central Catheter Placement  The IV Nurse has discussed with the patient and/or persons authorized to consent for the patient, the purpose of this procedure and the potential benefits and risks involved with this procedure.  The benefits include less needle sticks, lab draws from the catheter, and the patient may be discharged home with the catheter. Risks include, but not limited to, infection, bleeding, blood clot (thrombus formation), and puncture of an artery; nerve damage and irregular heartbeat and possibility to perform a PICC exchange if needed/ordered by physician.  Alternatives to this procedure were also discussed.  Bard Power PICC patient education guide, fact sheet on infection prevention and patient information card has been provided to patient /or left at bedside.    PICC Placement Documentation  PICC Single Lumen 12/24/21 Right Basilic 40 cm 0 cm (Active)  Indication for Insertion or Continuance of Line Home intravenous therapies (PICC only) 12/24/21 1200  Exposed Catheter (cm) 0 cm 12/24/21 1200  Site Assessment Clean, Dry, Intact 12/24/21 1200  Line Status Flushed;Saline locked;Blood return noted 12/24/21 1200  Dressing Type Transparent 12/24/21 1200  Dressing Status Antimicrobial disc in place 12/24/21 1200  Safety Lock Not Applicable 12/24/21 1200  Line Care Connections checked and tightened 12/24/21 1200  Dressing Intervention New dressing 12/24/21 1200  Dressing Change Due 12/31/21 12/24/21 1200       Franne Grip Renee 12/24/2021, 12:51 PM

## 2021-12-25 ENCOUNTER — Other Ambulatory Visit (HOSPITAL_COMMUNITY): Payer: Self-pay

## 2021-12-25 ENCOUNTER — Telehealth (HOSPITAL_COMMUNITY): Payer: Self-pay | Admitting: Pharmacy Technician

## 2021-12-25 DIAGNOSIS — M00862 Arthritis due to other bacteria, left knee: Secondary | ICD-10-CM | POA: Diagnosis not present

## 2021-12-25 LAB — BODY FLUID CULTURE W GRAM STAIN

## 2021-12-25 LAB — BASIC METABOLIC PANEL
Anion gap: 12 (ref 5–15)
BUN: <5 mg/dL — ABNORMAL LOW (ref 6–20)
CO2: 27 mmol/L (ref 22–32)
Calcium: 9 mg/dL (ref 8.9–10.3)
Chloride: 101 mmol/L (ref 98–111)
Creatinine, Ser: 0.58 mg/dL (ref 0.44–1.00)
GFR, Estimated: >60 mL/min (ref >60)
Glucose, Bld: 141 mg/dL — ABNORMAL HIGH (ref 70–99)
Potassium: 3.4 mmol/L — ABNORMAL LOW (ref 3.5–5.1)
Sodium: 140 mmol/L (ref 135–145)

## 2021-12-25 LAB — GLUCOSE, CAPILLARY
Glucose-Capillary: 149 mg/dL — ABNORMAL HIGH (ref 70–99)
Glucose-Capillary: 173 mg/dL — ABNORMAL HIGH (ref 70–99)
Glucose-Capillary: 224 mg/dL — ABNORMAL HIGH (ref 70–99)
Glucose-Capillary: 172 mg/dL — ABNORMAL HIGH (ref 70–99)

## 2021-12-25 MED ORDER — POTASSIUM CHLORIDE CRYS ER 20 MEQ PO TBCR
40.0000 meq | EXTENDED_RELEASE_TABLET | Freq: Once | ORAL | Status: AC
Start: 2021-12-25 — End: 2021-12-25
  Administered 2021-12-25: 40 meq via ORAL
  Filled 2021-12-25: qty 2

## 2021-12-25 MED ORDER — EMTRICITABINE-TENOFOVIR DF 200-300 MG PO TABS
1.0000 | ORAL_TABLET | Freq: Every day | ORAL | 0 refills | Status: AC
  Filled 2021-12-25: qty 30, 30d supply, fill #0

## 2021-12-25 NOTE — Progress Notes (Signed)
Subjective:  Patient reports pain as moderate.  Denies N/V/CP/SOB/Abd pain. She states she is still having a lot of pain in her knee just below the drain site mostly. It is really painful with movement. She denies tingling and numbness in LE bilaterally. She states she is having a hard time with her pain and being in the hospital. She is very tearful today.   We discussed removal of her drain today as the output has decreased and is partially clogged. Patient stated she didn't think it was time for it to come out and that it has been clogged. We discussed that this can happen with these drains and as long as the output is greater than 30cc per shift we leave it it until it is below that amount and then we remove them routinely, which is where she is at now. Patient understands and is in agreement with this plan.   Objective:   VITALS:   Vitals:   12/24/21 2112 12/25/21 0659 12/25/21 0926 12/25/21 1648  BP: 129/83 119/90 130/88 132/89  Pulse: (!) 109 92 95 (!) 110  Resp: 18 16 18 18   Temp: 100.2 F (37.9 C)  98.1 F (36.7 C) 98.2 F (36.8 C)  TempSrc: Oral     SpO2: 100% 100% 96% 98%  Weight:      Height:        Patient lying in bed with covers over her head. She appears distressed and uncomfortable.  Neurologically intact ABD soft Neurovascular intact Sensation intact distally Intact pulses distally Dorsiflexion/Plantar flexion intact Incision: dressing C/D/I No cellulitis present Compartment soft Hemovac drain in place. 20cc of straw colored fluid. Drain is partially clogged with a piece of tissue. Drain was removed today and dry dressing with gauze and ABD pad placed over drain site. Ace bandage reapplied.   Lab Results  Component Value Date   WBC 7.9 12/24/2021   HGB 9.6 (L) 12/24/2021   HCT 27.7 (L) 12/24/2021   MCV 82.2 12/24/2021   PLT 325 12/24/2021   BMET    Component Value Date/Time   NA 140 12/25/2021 0336   K 3.4 (L) 12/25/2021 0336   CL 101  12/25/2021 0336   CO2 27 12/25/2021 0336   GLUCOSE 141 (H) 12/25/2021 0336   BUN <5 (L) 12/25/2021 0336   CREATININE 0.58 12/25/2021 0336   CALCIUM 9.0 12/25/2021 0336   GFRNONAA >60 12/25/2021 0336   Results for orders placed or performed during the hospital encounter of 12/20/21  Body fluid culture w Gram Stain     Status: None   Collection Time: 12/21/21 12:22 AM   Specimen: KNEE; Body Fluid  Result Value Ref Range Status   Specimen Description KNEE  Final   Special Requests NONE  Final   Gram Stain   Final    MODERATE WBC PRESENT,BOTH PMN AND MONONUCLEAR NO ORGANISMS SEEN    Culture   Final    RARE NEISSERIA GONORRHOEAE BETA LACTAMASE POSITIVE HEALTH DEPARTMENT NOTIFIED Performed at [facility], [address] [place], [place]    Report Status 12/25/2021 FINAL  Final  Culture, blood (Routine X 2) w Reflex to ID Panel     Status: None (Preliminary result)   Collection Time: 12/21/21  5:00 AM   Specimen: BLOOD  Result Value Ref Range Status   Specimen Description BLOOD RIGHT ANTECUBITAL  Final   Special Requests   Final    BOTTLES DRAWN AEROBIC AND ANAEROBIC Blood Culture adequate volume  Culture   Final    NO GROWTH 4 DAYS Performed at [facility], [address], [place], [place] [address]    Report Status PENDING  Incomplete  Aerobic/Anaerobic Culture w Gram Stain (surgical/deep wound)     Status: None (Preliminary result)   Collection Time: 12/21/21 10:43 AM   Specimen: Synovium; Tissue  Result Value Ref Range Status   Specimen Description TISSUE  Final   Special Requests LEFT KNEE SYNOVIUM  Final   Gram Stain   Final    RARE WBC PRESENT,BOTH PMN AND MONONUCLEAR NO ORGANISMS SEEN    Culture   Final    NO GROWTH 4 DAYS Performed at [facility], [address] [place], [place] [address]    Report Status PENDING  Incomplete  Culture, blood (Routine X 2) w Reflex to ID Panel     Status: None (Preliminary result)   Collection  Time: 12/21/21  2:16 PM   Specimen: BLOOD  Result Value Ref Range Status   Specimen Description BLOOD SITE NOT SPECIFIED  Final   Special Requests   Final    BOTTLES DRAWN AEROBIC ONLY Blood Culture results may not be optimal due to an inadequate volume of blood received in culture bottles   Culture   Final    NO GROWTH 4 DAYS Performed at [facility], [address] [place], [place] [address]    Report Status PENDING  Incomplete     Assessment/Plan: 4 Days Post-Op   Principal Problem:   Septic joint of left knee joint (HCC) Active Problems:   Type 2 diabetes mellitus with hyperglycemia, with long-term current use of insulin (HCC)   h/o Gonorrhea   Hyponatremia   Hypokalemia    Fluid culture shows Neisseria gonorrhea. -Continue ceftriaxone.  PICC line placed 7/12.   History of gonorrhea History of syphilis -Treated with IM Rocephin 10/22/2021 -Follow-up with infectious disease  WBAT with walker DVT ppx: Lovenox during hospital stay. She will switch to aspirin 81mg  BID for 2 weeks.  PO pain control PT/OT: Continue to work with PT with ambulation and range of motion of the knee.  Dispo:  - Patient under hospitalist care, disposition per their recommendation. Infectious disease consulted and recommend IV antibiotics ceftriaxone until 01/31/22 via PICC line. Follow-up with Dr. 02/02/22. Appreciate their help with management. Continue glucose control with goal <200 for optimal healing.  - Continue cryotherapy as needed for pain and swelling. Hemovac drain removed today. Please change drain site dressing daily as needed. Aquacel dressing to remain in place. Patient to make appointment for follow-up with Dr. Thedore Mins office 2 weeks after surgery date. Pain medication and DVT ppx printed in chart.    Kathline Magic, PA-C.  12/25/2021, 5:07 PM  [facility]  [facility] [address], [address], [place], [place] Phone:  [phone] [email or web address] Facebook  [phone]

## 2021-12-25 NOTE — Discharge Instructions (Signed)
Dr. Samson Frederic Total Joint Specialist [facility] [address], [address] [place], [place] [address] [phone]  POSTOPERATIVE DIRECTIONS   Knee Rehabilitation, Guidelines Following Surgery  Results after knee surgery are often greatly improved when you follow the exercise, range of motion and muscle strengthening exercises prescribed by your doctor. Safety measures are also important to protect the knee from further injury. Any time any of these exercises cause you to have increased pain or swelling in your knee joint, decrease the amount until you are comfortable again and slowly increase them. If you have problems or questions, call your caregiver or physical therapist for advice.   WEIGHT BEARING Weight bearing as tolerated with assist device (walker, cane, etc) as directed, use it as long as suggested by your surgeon or therapist, typically at least 4-6 weeks.  HOME CARE INSTRUCTIONS  Remove items at home which could result in a fall. This includes throw rugs or furniture in walking pathways.  Continue medications as instructed at time of discharge. You may have some home medications which will be placed on hold until you complete the course of blood thinner medication.  You may start showering once you are discharged home but do not submerge the incision under water. Just pat the incision dry and apply a dry gauze dressing on daily. Walk with walker as instructed.  Use walker as long as suggested by your caregivers. Avoid periods of inactivity such as sitting longer than an hour when not asleep. This helps prevent blood clots.  You may put full weight on your legs and walk as much as is comfortable.  You may return to work once you are cleared by your doctor.  Do not drive a car for 6 weeks or until released by you surgeon.  Do not drive while taking narcotics.  Wear the elastic stockings for three weeks following surgery during the day but you may remove  then at night. Make sure you keep all of your appointments after your operation with all of your doctors and caregivers. You should call the office at the above phone number and make an appointment for approximately two weeks after the date of your surgery. Do not remove your surgical dressing. The dressing is waterproof; you may take showers in 3 days, but do not take tub baths or submerge the dressing. Please pick up a stool softener and laxative for home use as long as you are requiring pain medications. ICE to the affected knee every three hours for 30 minutes at a time and then as needed for pain and swelling.  Continue to use ice on the knee for pain and swelling from surgery. You may notice swelling that will progress down to the foot and ankle.  This is normal after surgery.  Elevate the leg when you are not up walking on it.   It is important for you to complete the blood thinner medication as prescribed by your doctor. Continue to use the breathing machine which will help keep your temperature down.  It is common for your temperature to cycle up and down following surgery, especially at night when you are not up moving around and exerting yourself.  The breathing machine keeps your lungs expanded and your temperature down.  RANGE OF MOTION AND STRENGTHENING EXERCISES  Rehabilitation of the knee is important following a knee injury or an operation. After just a few days of immobilization, the muscles of the thigh which control the knee become weakened and shrink (atrophy).  Knee exercises are designed to build up the tone and strength of the thigh muscles and to improve knee motion. Often times heat used for twenty to thirty minutes before working out will loosen up your tissues and help with improving the range of motion but do not use heat for the first two weeks following surgery. These exercises can be done on a training (exercise) mat, on the floor, on a table or on a bed. Use what ever works  the best and is most comfortable for you Knee exercises include:  Leg Lifts - While your knee is still immobilized in a splint or cast, you can do straight leg raises. Lift the leg to 60 degrees, hold for 3 sec, and slowly lower the leg. Repeat 10-20 times 2-3 times daily. Perform this exercise against resistance later as your knee gets better.  Quad and Hamstring Sets - Tighten up the muscle on the front of the thigh (Quad) and hold for 5-10 sec. Repeat this 10-20 times hourly. Hamstring sets are done by pushing the foot backward against an object and holding for 5-10 sec. Repeat as with quad sets.  A rehabilitation program following serious knee injuries can speed recovery and prevent re-injury in the future due to weakened muscles. Contact your doctor or a physical therapist for more information on knee rehabilitation.   POST-OPERATIVE OPIOID TAPER INSTRUCTIONS: It is important to wean off of your opioid medication as soon as possible. If you do not need pain medication after your surgery it is ok to stop day one. Opioids include: Codeine, Hydrocodone(Norco, Vicodin), Oxycodone(Percocet, oxycontin) and hydromorphone amongst others.  Long term and even short term use of opiods can cause: Increased pain response Dependence Constipation Depression Respiratory depression And more.  Withdrawal symptoms can include Flu like symptoms Nausea, vomiting And more Techniques to manage these symptoms Hydrate well Eat regular healthy meals Stay active Use relaxation techniques(deep breathing, meditating, yoga) Do Not substitute Alcohol to help with tapering If you have been on opioids for less than two weeks and do not have pain than it is ok to stop all together.  Plan to wean off of opioids This plan should start within one week post op of your joint replacement. Maintain the same interval or time between taking each dose and first decrease the dose.  Cut the total daily intake of opioids by one  tablet each day Next start to increase the time between doses. The last dose that should be eliminated is the evening dose.     MAKE SURE YOU:  Understand these instructions.  Will watch your condition.  Will get help right away if you are not doing well or get worse.    Pick up stool softner and laxative for home use following surgery while on pain medications. Do NOT remove your aquacel dressing. You may remove the dressing covering your drain site daily as needed for drainage. Once it is not draining anymore then you do not have to keep it covered. You may shower.  Do not take tub baths or submerge incision under water. May shower starting three days after surgery. Please use a clean towel to pat the incision dry following showers. Continue to use ice for pain and swelling after surgery. Do not use any lotions or creams on the incision until instructed by your surgeon.  Please take your antibiotics as directed.  Please make an appointment to follow-up with Dr. Kathline Magic office 2 weeks after your surgery date. Please make an appointment  with infectious disease.

## 2021-12-25 NOTE — Progress Notes (Signed)
Physical Therapy Treatment Patient Details Name: Danielle Norman MRN: [id number] DOB: 03/04/2001 Today's Date: 12/25/2021   History of Present Illness 21 y.o. female who presented to the ED 12/20/21 for evaluation of left knee pain. Left knee x-ray showed suprapatellar joint effusion.  No fracture or dislocation identified. +septic arthritis; +aspiration; 7/9 Open arthrotomy and drainage left knee.  PMH significant for insulin-dependent type 2 diabetes, hidradenitis suppurativa    PT Comments    Patient emotional on arrival as she reports having to try to make it to bathroom on her own again today due to delay in nursing responding to call light. Noted portion of ace wrap at and above her knee very loose and wrinkled and pt appreciated having ace wrap removed and re-wrapped for comfort. Focus of session on stair training, therefore only walked a short distance prior to educating on up/down single step at bedside with bed elevated so bed rail could simulate stair rail. Patient tearful due to pain (despite premedication for pain) and only one step completed, however feel confident pt could ascend 4 steps at home when she is discharged. Will continue to focus on left knee ROM and gait tolerance.     Recommendations for follow up therapy are one component of a multi-disciplinary discharge planning process, led by the attending physician.  Recommendations may be updated based on patient status, additional functional criteria and insurance authorization.  Follow Up Recommendations  Follow physician's recommendations for discharge plan and follow up therapies (OPPT when MD clears patient)     Assistance Recommended at Discharge PRN  Patient can return home with the following Help with stairs or ramp for entrance   Equipment Recommendations  Rolling walker (2 wheels);BSC/3in1    Recommendations for Other Services       Precautions / Restrictions Precautions Precautions: Knee Precaution Booklet  Issued: No Precaution Comments: Educated not to put a pillow under her knee, again Restrictions Weight Bearing Restrictions: No     Mobility  Bed Mobility Overal bed mobility: Needs Assistance Bed Mobility: Supine to Sit     Supine to sit: Min assist Sit to supine: Min assist   General bed mobility comments: Pt required min assist for LLE support to bring out of bed and back into bed    Transfers Overall transfer level: Needs assistance Equipment used: Rolling walker (2 wheels) Transfers: Sit to/from Stand Sit to Stand: Supervision           General transfer comment: no cues needed, places left foot forward to avoid bending left knee    Ambulation/Gait Ambulation/Gait assistance: Supervision Gait Distance (Feet): 15 Feet Assistive device: Rolling walker (2 wheels) Gait Pattern/deviations: Decreased stride length, Decreased weight shift to left, Step-through pattern, Knee flexed in stance - left Gait velocity: very slow     General Gait Details: putting very little weight on LLE with incr weight through bil UEs; distance limited as planned to go over stairs   Stairs Stairs: Yes Stairs assistance: Min assist Stair Management: One rail Right, Step to pattern, Forwards (hand held assist on left) Number of Stairs: 1 General stair comments: pt tearful after practicing one step and further steps deferred; pt with minimal support via LUE and intuitively knew how to sequence to minimize strain on left knee   Wheelchair Mobility    Modified Rankin (Stroke Patients Only)       Balance Overall balance assessment: Needs assistance Sitting-balance support: No upper extremity supported, Feet supported Sitting balance-Leahy Scale: Normal  Standing balance support: Bilateral upper extremity supported, Reliant on assistive device for balance Standing balance-Leahy Scale: Poor Standing balance comment: due to pain, self-limiting wb'g on LLE                             Cognition Arousal/Alertness: Awake/alert Behavior During Therapy: Flat affect Overall Cognitive Status: Within Functional Limits for tasks assessed                                          Exercises General Exercises - Lower Extremity Ankle Circles/Pumps: AROM, 10 reps, Left Quad Sets: AROM, Left, 5 reps (with pillows under lower leg) Other Exercises Other Exercises: encouraged AROM knee--pt reports she works on during day but in too much pain now    General Comments        Pertinent Vitals/Pain Pain Assessment Pain Assessment: 0-10 Pain Score: 7  Pain Location: left knee Pain Descriptors / Indicators: Constant, Grimacing, Guarding, Crying Pain Intervention(s): Limited activity within patient's tolerance, Monitored during session, Premedicated before session, Repositioned, Other (comment) (re-wrapped ace wrap as it was all bunched up)    Home Living                          Prior Function            PT Goals (current goals can now be found in the care plan section) Acute Rehab PT Goals Patient Stated Goal: none stated; focused on pain PT Goal Formulation: With patient Time For Goal Achievement: 01/04/22 Potential to Achieve Goals: Good Progress towards PT goals: Progressing toward goals    Frequency    Min 5X/week      PT Plan Current plan remains appropriate    Co-evaluation              AM-PAC PT "6 Clicks" Mobility   Outcome Measure  Help needed turning from your back to your side while in a flat bed without using bedrails?: None Help needed moving from lying on your back to sitting on the side of a flat bed without using bedrails?: A Little Help needed moving to and from a bed to a chair (including a wheelchair)?: A Little Help needed standing up from a chair using your arms (e.g., wheelchair or bedside chair)?: A Little Help needed to walk in hospital room?: A Little Help needed climbing 3-5 steps with a  railing? : A Little 6 Click Score: 19    End of Session   Activity Tolerance: Patient limited by pain Patient left: with call bell/phone within reach;in bed (Refused elevation at this time due to pain. Ice placed)   PT Visit Diagnosis: Difficulty in walking, not elsewhere classified (R26.2)     Time: 9381-8299 PT Time Calculation (min) (ACUTE ONLY): 37 min  Charges:  $Gait Training: 23-37 mins                      Jerolyn Center, PT Acute Rehabilitation Services  Office [phone]    Zena Amos 12/25/2021, 3:51 PM

## 2021-12-25 NOTE — Telephone Encounter (Signed)
Pharmacy Patient Advocate Encounter  Insurance verification completed.    The patient is insured through [facility]   The patient is currently admitted and ran test claims for the following: Truvada.  Copays and coinsurance results were relayed to Inpatient clinical team.

## 2021-12-25 NOTE — Progress Notes (Signed)
PROGRESS NOTE    Danielle Norman  GTX:[id number] DOB: 07-Dec-2000 DOA: 12/20/2021 PCP: Christel Mormon, MD     Brief Narrative:  Danielle Norman is a 21 year old female who has insulin requiring diabetes mellitus, hidradenitis suppurativa who presented to the ED on 7/9 for knee pain after hitting it at work on 7/6.  She is also noted swelling of the knee and increased warmth with difficulty ambulating.   Previously seen in the ED 10/20/2021 at which time diagnosed with bacterial vaginosis treated with Flagyl.  Also given doxycycline for treatment of hidradenitis abscess.  Work-up also positive for gonorrhea, negative for chlamydia.  She was seen in follow-up 10/22/2021 and given IM ceftriaxone for treatment of gonorrhea.  This admission, she was found to have septic arthritis in left knee and underwent I&D on 7/9. Cultures NGTD. On Ceftriaxone.   New events last 24 hours / Subjective: Wondering about left lower leg swelling  Assessment & Plan:   Principal Problem:   Septic joint of left knee joint (HCC) Active Problems:   Type 2 diabetes mellitus with hyperglycemia, with long-term current use of insulin (HCC)   h/o Gonorrhea   Hyponatremia   Hypokalemia   Septic joint of the left knee -Status post open arthrotomy and drainage of left knee 7/9.  Fluid culture shows Neisseria gonorrhea.  Drain in place - "Continue hemovac drain until less than 30cc of drainage per shift. Drain will need to be removed prior to discharge." -Continue ceftriaxone.  PICC line placed 7/12 -Infectious disease following, recommended IV antibiotics, end date 8/19 -Has follow-up with Dr. Thedore Mins 7/25 -Encourage mobility  Diabetes mellitus type 2 with hyperglycemia -A1c 14.7 -Semglee, NovoLog, sliding scale  History of gonorrhea History of syphilis -Treated with IM Rocephin 10/22/2021 -Follow-up with infectious disease  Hypokalemia -Replace   DVT prophylaxis:  enoxaparin (LOVENOX) injection 40 mg  Start: 12/22/21 0800 SCDs Start: 12/21/21 1257  Code Status: Full Family Communication: None at bedside  Disposition Plan:  Status is: Inpatient Remains inpatient appropriate because: Pain control, drain management   Antimicrobials:  Anti-infectives (From admission, onward)    Start     Dose/Rate Route Frequency Ordered Stop   12/25/21 1000  emtricitabine-tenofovir (TRUVADA) 200-300 MG per tablet 1 tablet        1 tablet Oral Daily 12/24/21 1613     12/25/21 0000  emtricitabine-tenofovir (TRUVADA) 200-300 MG tablet        1 tablet Oral Daily 12/25/21 0735     12/21/21 0924  ceFAZolin (ANCEF) 2-4 GM/100ML-% IVPB       Note to Pharmacy: Garen Lah E: cabinet override      12/21/21 0924 12/21/21 2129   12/21/21 0915  ceFAZolin (ANCEF) IVPB 2g/100 mL premix  Status:  Discontinued        2 g 200 mL/hr over 30 Minutes Intravenous On call to O.R. 12/21/21 0905 12/21/21 1248   12/21/21 0800  vancomycin (VANCOREADY) IVPB 1250 mg/250 mL  Status:  Discontinued        1,250 mg 166.7 mL/hr over 90 Minutes Intravenous Every 8 hours 12/21/21 0125 12/23/21 1136   12/21/21 0130  cefTRIAXone (ROCEPHIN) 2 g in sodium chloride 0.9 % 100 mL IVPB        2 g 200 mL/hr over 30 Minutes Intravenous Daily at 10 pm 12/21/21 0118     12/21/21 0030  ceFEPIme (MAXIPIME) 2 g in sodium chloride 0.9 % 100 mL IVPB  Status:  Discontinued  2 g 200 mL/hr over 30 Minutes Intravenous Every 8 hours 12/21/21 0027 12/21/21 0118   12/21/21 0030  vancomycin (VANCOREADY) IVPB 2000 mg/400 mL        2,000 mg 200 mL/hr over 120 Minutes Intravenous  Once 12/21/21 0027 12/21/21 0424        Objective: Vitals:   12/24/21 0910 12/24/21 2112 12/25/21 0659 12/25/21 0926  BP: (!) 128/96 129/83 119/90 130/88  Pulse: (!) 105 (!) 109 92 95  Resp: 18 18 16 18   Temp: 98.7 F (37.1 C) 100.2 F (37.9 C)  98.1 F (36.7 C)  TempSrc: Oral Oral    SpO2: 100% 100% 100% 96%  Weight:      Height:        Intake/Output  Summary (Last 24 hours) at 12/25/2021 1320 Last data filed at 12/25/2021 0800 Gross per 24 hour  Intake 420 ml  Output 880 ml  Net -460 ml    Filed Weights   12/21/21 0100 12/21/21 1303  Weight: 98.3 kg 97.5 kg    Examination:  General exam: Appears calm and comfortable  Respiratory system: Clear to auscultation. Respiratory effort normal. No respiratory distress. No conversational dyspnea.  Cardiovascular system: S1 & S2 heard, RRR. No murmurs. No pedal edema. Gastrointestinal system: Abdomen is nondistended, soft and nontender. Normal bowel sounds heard. Central nervous system: Alert and oriented. No focal neurological deficits. Speech clear.  Extremities: Left lower extremity with Hemovac drain in place, dressing in place. Psychiatry: Mood & affect stable.   Data Reviewed: I have personally reviewed following labs and imaging studies  CBC: Recent Labs  Lab 12/20/21 2255 12/21/21 0440 12/21/21 1416 12/22/21 0031 12/24/21 0802  WBC 13.9* 13.1* 12.0* 10.7* 7.9  NEUTROABS 9.0*  --   --  7.3  --   HGB 12.2 11.7* 11.4* 9.8* 9.6*  HCT 36.0 34.5* 33.4* 28.9* 27.7*  MCV 82.6 82.7 82.1 81.9 82.2  PLT 267 244 250 242 325    Basic Metabolic Panel: Recent Labs  Lab 12/21/21 0440 12/21/21 1416 12/22/21 0031 12/23/21 0345 12/24/21 0802 12/25/21 0336  NA 130*  --  132* 135 139 140  K 3.3*  --  3.7 3.4* 3.4* 3.4*  CL 97*  --  102 103 101 101  CO2 20*  --  20* 22 25 27   GLUCOSE 303*  --  258* 202* 197* 141*  BUN 6  --  6 5* <5* <5*  CREATININE 0.62 0.43* 0.62 0.48 0.35* 0.58  CALCIUM 8.5*  --  8.5* 8.4* 8.9 9.0  MG  --   --  1.7  --  1.8  --     GFR: Estimated Creatinine Clearance: 143.1 mL/min (by C-G formula based on SCr of 0.58 mg/dL). Liver Function Tests: Recent Labs  Lab 12/20/21 2255  AST 12*  ALT 15  ALKPHOS 104  BILITOT 0.9  PROT 7.6  ALBUMIN 3.2*    No results for input(s): "LIPASE", "AMYLASE" in the last 168 hours. No results for input(s):  "AMMONIA" in the last 168 hours. Coagulation Profile: No results for input(s): "INR", "PROTIME" in the last 168 hours. Cardiac Enzymes: No results for input(s): "CKTOTAL", "CKMB", "CKMBINDEX", "TROPONINI" in the last 168 hours. BNP (last 3 results) No results for input(s): "PROBNP" in the last 8760 hours. HbA1C: Recent Labs    12/23/21 0345  HGBA1C 14.7*    CBG: Recent Labs  Lab 12/24/21 1142 12/24/21 1559 12/24/21 2113 12/25/21 0724 12/25/21 1128  GLUCAP 226* 179* 225* 149* 173*  Lipid Profile: No results for input(s): "CHOL", "HDL", "LDLCALC", "TRIG", "CHOLHDL", "LDLDIRECT" in the last 72 hours. Thyroid Function Tests: No results for input(s): "TSH", "T4TOTAL", "FREET4", "T3FREE", "THYROIDAB" in the last 72 hours. Anemia Panel: No results for input(s): "VITAMINB12", "FOLATE", "FERRITIN", "TIBC", "IRON", "RETICCTPCT" in the last 72 hours. Sepsis Labs: No results for input(s): "PROCALCITON", "LATICACIDVEN" in the last 168 hours.  Recent Results (from the past 240 hour(s))  Body fluid culture w Gram Stain     Status: None   Collection Time: 12/21/21 12:22 AM   Specimen: KNEE; Body Fluid  Result Value Ref Range Status   Specimen Description KNEE  Final   Special Requests NONE  Final   Gram Stain   Final    MODERATE WBC PRESENT,BOTH PMN AND MONONUCLEAR NO ORGANISMS SEEN    Culture   Final    RARE NEISSERIA GONORRHOEAE BETA LACTAMASE POSITIVE HEALTH DEPARTMENT NOTIFIED Performed at [facility], [address], [place], [place] [address]    Report Status 12/25/2021 FINAL  Final  Culture, blood (Routine X 2) w Reflex to ID Panel     Status: None (Preliminary result)   Collection Time: 12/21/21  5:00 AM   Specimen: BLOOD  Result Value Ref Range Status   Specimen Description BLOOD RIGHT ANTECUBITAL  Final   Special Requests   Final    BOTTLES DRAWN AEROBIC AND ANAEROBIC Blood Culture adequate volume   Culture   Final    NO GROWTH 4 DAYS Performed at [facility], [address], [place], [place] [address]    Report Status PENDING  Incomplete  Aerobic/Anaerobic Culture w Gram Stain (surgical/deep wound)     Status: None (Preliminary result)   Collection Time: 12/21/21 10:43 AM   Specimen: Synovium; Tissue  Result Value Ref Range Status   Specimen Description TISSUE  Final   Special Requests LEFT KNEE SYNOVIUM  Final   Gram Stain   Final    RARE WBC PRESENT,BOTH PMN AND MONONUCLEAR NO ORGANISMS SEEN    Culture   Final    NO GROWTH 4 DAYS Performed at [facility], [address], [place], [place] [address]    Report Status PENDING  Incomplete  Culture, blood (Routine X 2) w Reflex to ID Panel     Status: None (Preliminary result)   Collection Time: 12/21/21  2:16 PM   Specimen: BLOOD  Result Value Ref Range Status   Specimen Description BLOOD SITE NOT SPECIFIED  Final   Special Requests   Final    BOTTLES DRAWN AEROBIC ONLY Blood Culture results may not be optimal due to an inadequate volume of blood received in culture bottles   Culture   Final    NO GROWTH 4 DAYS Performed at [facility], [address] [place], [place] [address]    Report Status PENDING  Incomplete      Radiology Studies: [place] EKG SITE RITE  Result Date: 12/24/2021 If Site Rite image not attached, placement could not be confirmed due to current cardiac rhythm.     Scheduled Meds:  Chlorhexidine Gluconate Cloth  6 each Topical Daily   docusate sodium  100 mg Oral BID   emtricitabine-tenofovir  1 tablet Oral Daily   enoxaparin (LOVENOX) injection  40 mg Subcutaneous Q24H   insulin aspart  0-15 Units Subcutaneous TID WC   insulin aspart  0-5 Units Subcutaneous QHS   insulin aspart  4 Units Subcutaneous TID WC   insulin  glargine-yfgn  18 Units Subcutaneous BID   oxyCODONE  10 mg Oral Q4H   pantoprazole  40 mg Oral Daily   polyethylene glycol  17 g Oral Daily   senna  1 tablet Oral BID   sodium chloride flush  10-40 mL Intracatheter  Q12H   Continuous Infusions:  cefTRIAXone (ROCEPHIN)  IV 2 g (12/24/21 2313)     LOS: 4 days     Noralee Stain, DO [facility] 12/25/2021, 1:20 PM   Available via Epic secure chat 7am-7pm After these hours, please refer to coverage provider listed on [email or web address]

## 2021-12-25 NOTE — TOC Benefit Eligibility Note (Signed)
Patient Product/process development scientist completed.    The patient is currently admitted and upon discharge could be taking Truvada.  The current 30 day co-pay is, $4.00.   The patient is insured through [facility]     Roland Earl, CPhT Pharmacy Patient Advocate Specialist [facility] Patient Advocate Team Direct Number: [phone]  Fax: [phone]

## 2021-12-26 DIAGNOSIS — M00862 Arthritis due to other bacteria, left knee: Secondary | ICD-10-CM | POA: Diagnosis not present

## 2021-12-26 LAB — CULTURE, BLOOD (ROUTINE X 2)
Culture: NO GROWTH
Culture: NO GROWTH
Special Requests: ADEQUATE

## 2021-12-26 LAB — BASIC METABOLIC PANEL
Anion gap: 13 (ref 5–15)
BUN: 5 mg/dL — ABNORMAL LOW (ref 6–20)
CO2: 28 mmol/L (ref 22–32)
Calcium: 9.1 mg/dL (ref 8.9–10.3)
Chloride: 98 mmol/L (ref 98–111)
Creatinine, Ser: 0.56 mg/dL (ref 0.44–1.00)
GFR, Estimated: >60 mL/min (ref >60)
Glucose, Bld: 156 mg/dL — ABNORMAL HIGH (ref 70–99)
Potassium: 3.7 mmol/L (ref 3.5–5.1)
Sodium: 139 mmol/L (ref 135–145)

## 2021-12-26 LAB — GLUCOSE, CAPILLARY
Glucose-Capillary: 164 mg/dL — ABNORMAL HIGH (ref 70–99)
Glucose-Capillary: 216 mg/dL — ABNORMAL HIGH (ref 70–99)
Glucose-Capillary: 147 mg/dL — ABNORMAL HIGH (ref 70–99)
Glucose-Capillary: 176 mg/dL — ABNORMAL HIGH (ref 70–99)

## 2021-12-26 LAB — AEROBIC/ANAEROBIC CULTURE W GRAM STAIN (SURGICAL/DEEP WOUND): Culture: NO GROWTH

## 2021-12-26 LAB — MAGNESIUM: Magnesium: 1.7 mg/dL (ref 1.7–2.4)

## 2021-12-26 MED ORDER — CEFTRIAXONE IV (FOR PTA / DISCHARGE USE ONLY): 2.0000 g | INTRAVENOUS | 0 refills | Status: AC

## 2021-12-26 MED ORDER — MORPHINE SULFATE (PF) 2 MG/ML IV SOLN
1.0000 mg | INTRAVENOUS | Status: DC | PRN
  Administered 2021-12-26 – 2021-12-27 (×3): 1 mg via INTRAVENOUS
  Filled 2021-12-26 (×3): qty 1

## 2021-12-26 MED ORDER — TRAMADOL HCL 50 MG PO TABS
50.0000 mg | ORAL_TABLET | Freq: Four times a day (QID) | ORAL | Status: DC | PRN
  Administered 2021-12-26 (×3): 50 mg via ORAL
  Filled 2021-12-26 (×3): qty 1

## 2021-12-26 NOTE — Progress Notes (Signed)
PROGRESS NOTE    Danielle Norman  IRJ:[id number] DOB: 2001/01/25 DOA: 12/20/2021 PCP: Christel Mormon, MD     Brief Narrative:  Danielle Norman is a 21 year old female who has insulin requiring diabetes mellitus, hidradenitis suppurativa who presented to the ED on 7/9 for knee pain after hitting it at work on 7/6.  She is also noted swelling of the knee and increased warmth with difficulty ambulating.   Previously seen in the ED 10/20/2021 at which time diagnosed with bacterial vaginosis treated with Flagyl.  Also given doxycycline for treatment of hidradenitis abscess.  Work-up also positive for gonorrhea, negative for chlamydia.  She was seen in follow-up 10/22/2021 and given IM ceftriaxone for treatment of gonorrhea.  This admission, she was found to have septic arthritis in left knee and underwent I&D on 7/9. Cultures NGTD. On Ceftriaxone.   New events last 24 hours / Subjective: Continues to have pain in the left leg interfering with her mobility.  Discussed weaning down on IV pain medication in order to work on discharging home  Assessment & Plan:   Principal Problem:   Septic joint of left knee joint (HCC) Active Problems:   Type 2 diabetes mellitus with hyperglycemia, with long-term current use of insulin (HCC)   h/o Gonorrhea   Hyponatremia   Hypokalemia   Septic joint of the left knee -Status post open arthrotomy and drainage of left knee 7/9.  Fluid culture shows Neisseria gonorrhea.  Drain removed 7/13. -Continue ceftriaxone.  PICC line placed 7/12 -Infectious disease following, recommended IV antibiotics, end date 8/19 -Has follow-up with Dr. Thedore Mins 7/25 -Encourage mobility -Continue to work on weaning down IV pain medication  Diabetes mellitus type 2 with hyperglycemia -A1c 14.7 -Semglee, NovoLog, sliding scale  History of gonorrhea History of syphilis -Treated with IM Rocephin 10/22/2021 -Follow-up with infectious disease -Started on Truvada    DVT  prophylaxis:  enoxaparin (LOVENOX) injection 40 mg Start: 12/22/21 0800 SCDs Start: 12/21/21 1257  Code Status: Full Family Communication: None at bedside  Disposition Plan:  Status is: Inpatient Remains inpatient appropriate because: Pain control   Antimicrobials:  Anti-infectives (From admission, onward)    Start     Dose/Rate Route Frequency Ordered Stop   12/26/21 0000  cefTRIAXone (ROCEPHIN) IVPB        2 g Intravenous Every 24 hours 12/26/21 1137 01/20/22 2359   12/25/21 1000  emtricitabine-tenofovir (TRUVADA) 200-300 MG per tablet 1 tablet        1 tablet Oral Daily 12/24/21 1613     12/25/21 0000  emtricitabine-tenofovir (TRUVADA) 200-300 MG tablet        1 tablet Oral Daily 12/25/21 0735     12/21/21 0924  ceFAZolin (ANCEF) 2-4 GM/100ML-% IVPB       Note to Pharmacy: Garen Lah E: cabinet override      12/21/21 0924 12/21/21 2129   12/21/21 0915  ceFAZolin (ANCEF) IVPB 2g/100 mL premix  Status:  Discontinued        2 g 200 mL/hr over 30 Minutes Intravenous On call to O.R. 12/21/21 0905 12/21/21 1248   12/21/21 0800  vancomycin (VANCOREADY) IVPB 1250 mg/250 mL  Status:  Discontinued        1,250 mg 166.7 mL/hr over 90 Minutes Intravenous Every 8 hours 12/21/21 0125 12/23/21 1136   12/21/21 0130  cefTRIAXone (ROCEPHIN) 2 g in sodium chloride 0.9 % 100 mL IVPB        2 g 200 mL/hr over 30 Minutes Intravenous Daily  at 10 pm 12/21/21 0118     12/21/21 0030  ceFEPIme (MAXIPIME) 2 g in sodium chloride 0.9 % 100 mL IVPB  Status:  Discontinued        2 g 200 mL/hr over 30 Minutes Intravenous Every 8 hours 12/21/21 0027 12/21/21 0118   12/21/21 0030  vancomycin (VANCOREADY) IVPB 2000 mg/400 mL        2,000 mg 200 mL/hr over 120 Minutes Intravenous  Once 12/21/21 0027 12/21/21 0424        Objective: Vitals:   12/25/21 1648 12/25/21 2053 12/26/21 0415 12/26/21 0756  BP: 132/89 (!) 136/91 121/81 130/88  Pulse: (!) 110 99 97 (!) 101  Resp: 18 18 16 14   Temp: 98.2 F  (36.8 C) 99.5 F (37.5 C) 99.1 F (37.3 C) 97.8 F (36.6 C)  TempSrc:  Oral Oral Oral  SpO2: 98% 98% 100% 100%  Weight:      Height:        Intake/Output Summary (Last 24 hours) at 12/26/2021 1224 Last data filed at 12/26/2021 1100 Gross per 24 hour  Intake 840 ml  Output 2550 ml  Net -1710 ml    Filed Weights   12/21/21 0100 12/21/21 1303  Weight: 98.3 kg 97.5 kg    Examination:  General exam: Appears calm  Respiratory system: Clear to auscultation. Respiratory effort normal. No respiratory distress. No conversational dyspnea.  On room air Cardiovascular system: S1 & S2 heard, RRR. No murmurs. No pedal edema. Gastrointestinal system: Abdomen is nondistended, soft and nontender. Normal bowel sounds heard. Central nervous system: Alert and oriented. Extremities: Left lower extremity with dressing in place. Psychiatry: Mood & affect stable.   Data Reviewed: I have personally reviewed following labs and imaging studies  CBC: Recent Labs  Lab 12/20/21 2255 12/21/21 0440 12/21/21 1416 12/22/21 0031 12/24/21 0802  WBC 13.9* 13.1* 12.0* 10.7* 7.9  NEUTROABS 9.0*  --   --  7.3  --   HGB 12.2 11.7* 11.4* 9.8* 9.6*  HCT 36.0 34.5* 33.4* 28.9* 27.7*  MCV 82.6 82.7 82.1 81.9 82.2  PLT 267 244 250 242 325    Basic Metabolic Panel: Recent Labs  Lab 12/22/21 0031 12/23/21 0345 12/24/21 0802 12/25/21 0336 12/26/21 0457  NA 132* 135 139 140 139  K 3.7 3.4* 3.4* 3.4* 3.7  CL 102 103 101 101 98  CO2 20* 22 25 27 28   GLUCOSE 258* 202* 197* 141* 156*  BUN 6 5* <5* <5* 5*  CREATININE 0.62 0.48 0.35* 0.58 0.56  CALCIUM 8.5* 8.4* 8.9 9.0 9.1  MG 1.7  --  1.8  --  1.7    GFR: Estimated Creatinine Clearance: 143.1 mL/min (by C-G formula based on SCr of 0.56 mg/dL). Liver Function Tests: Recent Labs  Lab 12/20/21 2255  AST 12*  ALT 15  ALKPHOS 104  BILITOT 0.9  PROT 7.6  ALBUMIN 3.2*    No results for input(s): "LIPASE", "AMYLASE" in the last 168 hours. No  results for input(s): "AMMONIA" in the last 168 hours. Coagulation Profile: No results for input(s): "INR", "PROTIME" in the last 168 hours. Cardiac Enzymes: No results for input(s): "CKTOTAL", "CKMB", "CKMBINDEX", "TROPONINI" in the last 168 hours. BNP (last 3 results) No results for input(s): "PROBNP" in the last 8760 hours. HbA1C: No results for input(s): "HGBA1C" in the last 72 hours.  CBG: Recent Labs  Lab 12/25/21 1128 12/25/21 1647 12/25/21 2057 12/26/21 0751 12/26/21 1122  GLUCAP 173* 224* 172* 164* 216*    Lipid  Profile: No results for input(s): "CHOL", "HDL", "LDLCALC", "TRIG", "CHOLHDL", "LDLDIRECT" in the last 72 hours. Thyroid Function Tests: No results for input(s): "TSH", "T4TOTAL", "FREET4", "T3FREE", "THYROIDAB" in the last 72 hours. Anemia Panel: No results for input(s): "VITAMINB12", "FOLATE", "FERRITIN", "TIBC", "IRON", "RETICCTPCT" in the last 72 hours. Sepsis Labs: No results for input(s): "PROCALCITON", "LATICACIDVEN" in the last 168 hours.  Recent Results (from the past 240 hour(s))  Body fluid culture w Gram Stain     Status: None   Collection Time: 12/21/21 12:22 AM   Specimen: KNEE; Body Fluid  Result Value Ref Range Status   Specimen Description KNEE  Final   Special Requests NONE  Final   Gram Stain   Final    MODERATE WBC PRESENT,BOTH PMN AND MONONUCLEAR NO ORGANISMS SEEN    Culture   Final    RARE NEISSERIA GONORRHOEAE BETA LACTAMASE POSITIVE HEALTH DEPARTMENT NOTIFIED Performed at [facility], [address] [place], [place] [address]    Report Status 12/25/2021 FINAL  Final  Culture, blood (Routine X 2) w Reflex to ID Panel     Status: None   Collection Time: 12/21/21  5:00 AM   Specimen: BLOOD  Result Value Ref Range Status   Specimen Description BLOOD RIGHT ANTECUBITAL  Final   Special Requests   Final    BOTTLES DRAWN AEROBIC AND ANAEROBIC Blood Culture adequate volume   Culture   Final    NO GROWTH 5 DAYS Performed  at [facility], [address] [place], [place] [address]    Report Status 12/26/2021 FINAL  Final  Aerobic/Anaerobic Culture w Gram Stain (surgical/deep wound)     Status: None (Preliminary result)   Collection Time: 12/21/21 10:43 AM   Specimen: Synovium; Tissue  Result Value Ref Range Status   Specimen Description TISSUE  Final   Special Requests LEFT KNEE SYNOVIUM  Final   Gram Stain   Final    RARE WBC PRESENT,BOTH PMN AND MONONUCLEAR NO ORGANISMS SEEN    Culture   Final    NO GROWTH 3 DAYS NO ANAEROBES ISOLATED; CULTURE IN PROGRESS FOR 5 DAYS Performed at [facility], [address] [place], [place] [address]    Report Status PENDING  Incomplete  Culture, blood (Routine X 2) w Reflex to ID Panel     Status: None   Collection Time: 12/21/21  2:16 PM   Specimen: BLOOD  Result Value Ref Range Status   Specimen Description BLOOD SITE NOT SPECIFIED  Final   Special Requests   Final    BOTTLES DRAWN AEROBIC ONLY Blood Culture results may not be optimal due to an inadequate volume of blood received in culture bottles   Culture   Final    NO GROWTH 5 DAYS Performed at [facility], [address] [place], [place] [address]    Report Status 12/26/2021 FINAL  Final      Radiology Studies: No results found.    Scheduled Meds:  Chlorhexidine Gluconate Cloth  6 each Topical Daily   docusate sodium  100 mg Oral BID   emtricitabine-tenofovir  1 tablet Oral Daily   enoxaparin (LOVENOX) injection  40 mg Subcutaneous Q24H   insulin aspart  0-15 Units Subcutaneous TID WC   insulin aspart  0-5 Units Subcutaneous QHS   insulin aspart  4 Units Subcutaneous TID WC   insulin glargine-yfgn  18 Units Subcutaneous BID   oxyCODONE  10 mg Oral Q4H  pantoprazole  40 mg Oral Daily   polyethylene glycol  17 g Oral Daily   senna  1 tablet Oral BID   sodium chloride flush  10-40 mL Intracatheter Q12H   Continuous Infusions:  cefTRIAXone (ROCEPHIN)  IV 2 g (12/25/21  2156)     LOS: 5 days     Noralee Stain, DO [facility] 12/26/2021, 12:24 PM   Available via Epic secure chat 7am-7pm After these hours, please refer to coverage provider listed on [email or web address]

## 2021-12-26 NOTE — Progress Notes (Signed)
Physical Therapy Treatment Patient Details Name: Danielle Norman MRN: [id number] DOB: 25-Apr-2001 Today's Date: 12/26/2021   History of Present Illness 21 y.o. female who presented to the ED 12/20/21 for evaluation of left knee pain. Left knee x-ray showed suprapatellar joint effusion.  No fracture or dislocation identified. +septic arthritis; +aspiration; 7/9 Open arthrotomy and drainage left knee.  PMH significant for insulin-dependent type 2 diabetes, hidradenitis suppurativa.    PT Comments    Pt is progressing slowly towards goals of PT. Pt extremely tearful due to pain, despite premedication for pain. Pt required min A for bed mobility with assist for LLE manuevering, and supervision for safety with transfers and ambulation to the bathroom with a RW. Today's session focused on increasing L knee ROM into flexion and extension with AROM, AAROM, and PROM activities in supine and seated positions. Pt reports increased pain with L knee extension > flexion. Pt educated on importance of not propping L knee onto pillow and encouraged ROM activities throughout the day. Will continue to focus on L knee ROM and functional mobility progression. Will continue to follow acutely.   Recommendations for follow up therapy are one component of a multi-disciplinary discharge planning process, led by the attending physician.  Recommendations may be updated based on patient status, additional functional criteria and insurance authorization.  Follow Up Recommendations  Follow physician's recommendations for discharge plan and follow up therapies     Assistance Recommended at Discharge PRN  Patient can return home with the following Help with stairs or ramp for entrance;A little help with walking and/or transfers;Assistance with cooking/housework;Assist for transportation   Equipment Recommendations  Rolling walker (2 wheels);BSC/3in1    Recommendations for Other Services       Precautions / Restrictions  Precautions Precautions: Fall;Knee Precaution Booklet Issued: No Precaution Comments: Educated not to put a pillow under her knee, again Restrictions Weight Bearing Restrictions: No     Mobility  Bed Mobility Overal bed mobility: Needs Assistance Bed Mobility: Supine to Sit, Sit to Supine     Supine to sit: Min assist Sit to supine: Min assist   General bed mobility comments: Pt required min assist for LLE support to bring out of bed and back into bed    Transfers Overall transfer level: Needs assistance Equipment used: Rolling walker (2 wheels) Transfers: Sit to/from Stand Sit to Stand: Supervision           General transfer comment: no cues needed, improvement with L knee bent to stand, PWB upon standing    Ambulation/Gait Ambulation/Gait assistance: Supervision Gait Distance (Feet): 15 Feet Assistive device: Rolling walker (2 wheels) Gait Pattern/deviations: Decreased step length - left, Step-to pattern, Decreased stance time - left, Antalgic, Wide base of support Gait velocity: very slow Gait velocity interpretation: <1.31 ft/sec, indicative of household ambulator Pre-gait activities: ROM on LLE in supine and seated General Gait Details: pt with extremely slow yet steady gait pattern, antalgic on L while crying. ambulated to bathroom with supervision for safety. c/o of not being able to put weight on LLE, however with encouragment will provide PWB on LLE.   Stairs             Wheelchair Mobility    Modified Rankin (Stroke Patients Only)       Balance Overall balance assessment: Needs assistance Sitting-balance support: No upper extremity supported, Feet supported Sitting balance-Leahy Scale: Good     Standing balance support: Bilateral upper extremity supported, Reliant on assistive device for balance Standing balance-Leahy Scale:  Poor Standing balance comment: due to pain, self-limiting WB on LLE                             Cognition Arousal/Alertness: Awake/alert Behavior During Therapy: Flat affect Overall Cognitive Status: Within Functional Limits for tasks assessed                                          Exercises Total Joint Exercises Heel Slides: PROM, AROM, AAROM, Left, 5 reps, Supine, Seated, Limitations Heel Slides Limitations: able to get ~30 degrees of knee flexion with AAROM heel slides, able to get ~40 degrees of knee flexion with PROM, able to get about ~30 degrees of knee flexion with AROM Long Arc Quad: AROM, 10 reps, Seated    General Comments General comments (skin integrity, edema, etc.): very tearful and reluctant to mobility, with encouragement able to assist with trip to bathroom, RN notified requesting pain meds.      Pertinent Vitals/Pain Pain Assessment Pain Assessment: 0-10 Pain Score: 8  Pain Location: left knee Pain Descriptors / Indicators: Constant, Grimacing, Guarding, Crying Pain Intervention(s): Limited activity within patient's tolerance, Monitored during session, Premedicated before session, Repositioned, RN gave pain meds during session, Ice applied    Home Living                          Prior Function            PT Goals (current goals can now be found in the care plan section) Acute Rehab PT Goals Patient Stated Goal: none stated; focused on pain PT Goal Formulation: With patient Time For Goal Achievement: 01/04/22 Potential to Achieve Goals: Good Progress towards PT goals: Progressing toward goals    Frequency    Min 5X/week      PT Plan Current plan remains appropriate    Co-evaluation              AM-PAC PT "6 Clicks" Mobility   Outcome Measure  Help needed turning from your back to your side while in a flat bed without using bedrails?: A Little Help needed moving from lying on your back to sitting on the side of a flat bed without using bedrails?: A Little Help needed moving to and from a bed to a chair  (including a wheelchair)?: A Little Help needed standing up from a chair using your arms (e.g., wheelchair or bedside chair)?: A Little Help needed to walk in hospital room?: Total Help needed climbing 3-5 steps with a railing? : Total 6 Click Score: 14    End of Session Equipment Utilized During Treatment: Gait belt Activity Tolerance: Patient limited by pain Patient left: in bed;with call bell/phone within reach;with nursing/sitter in room Nurse Communication: Mobility status;Patient requests pain meds PT Visit Diagnosis: Difficulty in walking, not elsewhere classified (R26.2);Pain Pain - Right/Left: Left Pain - part of body: Knee     Time: 1610-9604 PT Time Calculation (min) (ACUTE ONLY): 29 min  Charges:  $Therapeutic Exercise: 8-22 mins $Therapeutic Activity: 8-22 mins                     Melvyn Novas, MS, [place] Acute [facility] Services Office: [phone]   Melvyn Novas 12/26/2021, 12:46 PM

## 2021-12-26 NOTE — TOC Progression Note (Signed)
Transition of Care [facility]) - Progression Note    Patient Details  Name: YANIAH THIEMANN MRN: [id number] Date of Birth: 07/02/2000  Transition of Care [facility]) CM/SW Contact  Tom-Johnson, Hershal Coria, RN Phone Number: 12/26/2021, 4:03 PM  Clinical Narrative:     Patient continues IV abx and IV Morphine for pain management. Plan to wean off IV pain medication prior to d/c. On Oral Oxycodone as well. Drain d/c'd yesterday 12/25/21.  Patient will be discharging home on IV abx till 08/019/23. [employer] consulted for Home IV abx and Home Health RN from [facility]. Info on AVS.  Pam with [employer] made aware of possible d/c this weekend. CM will continue to follow with needs.     Barriers to Discharge: Continued Medical Work up  Expected Discharge Plan and Services     Discharge Planning Services: CM Consult   Living arrangements for the past 2 months: Single Family Home                                       Social Determinants of Health (SDOH) Interventions    Readmission Risk Interventions     No data to display

## 2021-12-27 DIAGNOSIS — M00862 Arthritis due to other bacteria, left knee: Secondary | ICD-10-CM | POA: Diagnosis not present

## 2021-12-27 LAB — GLUCOSE, CAPILLARY
Glucose-Capillary: 247 mg/dL — ABNORMAL HIGH (ref 70–99)
Glucose-Capillary: 136 mg/dL — ABNORMAL HIGH (ref 70–99)

## 2021-12-27 MED ORDER — DOCUSATE SODIUM 100 MG PO CAPS
100.0000 mg | ORAL_CAPSULE | Freq: Two times a day (BID) | ORAL | 0 refills | Status: AC
Start: 2021-12-27 — End: ?

## 2021-12-27 MED ORDER — MORPHINE SULFATE (PF) 2 MG/ML IV SOLN: 0.5000 mg | INTRAVENOUS | Status: DC | PRN

## 2021-12-27 MED ORDER — TRAMADOL HCL 50 MG PO TABS: 50.0000 mg | ORAL_TABLET | Freq: Four times a day (QID) | ORAL | 0 refills | Status: AC | PRN

## 2021-12-27 MED ORDER — HEPARIN SOD (PORK) LOCK FLUSH 100 UNIT/ML IV SOLN
250.0000 [IU] | INTRAVENOUS | Status: AC | PRN
  Administered 2021-12-27: 250 [IU]

## 2021-12-27 NOTE — TOC Transition Note (Signed)
Transition of Care [facility]) - CM/SW Discharge Note   Patient Details  Name: Danielle Norman MRN: [id number] Date of Birth: 06/20/2000  Transition of Care [facility]) CM/SW Contact:  Bess Kinds, RN Phone Number: [phone] 12/27/2021, 11:17 AM   Clinical Narrative:     Patient to transition home today. Verified with Pam at [facility] that teaching has been initiated and [facility] to f/u for [facility] on Monday. Patient to receive today's dose of IV ceftriaxone prior to discharge. No further TOC needs identified at this time.   Final next level of care: Home w Home Health Services Barriers to Discharge: No Barriers Identified   Patient Goals and CMS Choice Patient states their goals for this hospitalization and ongoing recovery are:: To return home CMS Medicare.gov Compare Post Acute Care list provided to:: Patient Choice offered to / list presented to : Patient  Discharge Placement                       Discharge Plan and Services   Discharge Planning Services: CM Consult                                 Social Determinants of Health (SDOH) Interventions     Readmission Risk Interventions     No data to display

## 2021-12-27 NOTE — Plan of Care (Signed)
  Problem: Health Behavior/Discharge Planning: Goal: Ability to manage health-related needs will improve Outcome: Adequate for Discharge  To be dischargd after 3pm antibiotics

## 2021-12-27 NOTE — Discharge Summary (Signed)
Physician Discharge Summary  Danielle Norman HWE:[id number] DOB: [birth year] DOA: 12/20/2021  PCP: Angeline Slim, MD  Admit date: 12/20/2021 Discharge date: 12/27/2021  Admitted From: Home Disposition:  Home  Recommendations for Outpatient Follow-up:  Follow up with PCP in 1 week Follow up with infectious disease  Follow-up with Dr. Lyla Glassing, orthopedic surgery  Discharge Condition: Stable CODE STATUS: Full code Diet recommendation: Carb modified  Brief/Interim Summary: Danielle Norman is a 21 year old female who has insulin requiring diabetes mellitus, hidradenitis suppurativa who presented to the ED on 7/9 for knee pain after hitting it at work on 7/6.  She is also noted swelling of the knee and increased warmth with difficulty ambulating.    Previously seen in the ED 10/20/2021 at which time diagnosed with bacterial vaginosis treated with Flagyl.  Also given doxycycline for treatment of hidradenitis abscess.  Work-up also positive for gonorrhea, negative for chlamydia.  She was seen in follow-up 10/22/2021 and given IM ceftriaxone for treatment of gonorrhea.   This admission, she was found to have septic arthritis in left knee and underwent I&D on 7/9. Cultures NGTD. On Ceftriaxone.  PICC line was placed for home antibiotics through 8/6.  Left knee drain was removed 7/13.  Patient's pain continued to improve slowly.  Discharge Diagnoses:  Principal Problem:   Septic joint of left knee joint ([place]) Active Problems:   Type 2 diabetes mellitus with hyperglycemia, with long-term current use of insulin (HCC)   h/o Gonorrhea   Hyponatremia   Hypokalemia   Septic joint of the left knee -Status post open arthrotomy and drainage of left knee 7/9.  Fluid culture shows Neisseria gonorrhea.  Drain removed 7/13. -Continue ceftriaxone.  PICC line placed 7/12 -Infectious disease following, recommended IV antibiotics, end date 8/6 -Has follow-up with Dr. Candiss Norse 7/25 -Follow-up with Dr.  Lyla Glassing next week -Encourage mobility -Continue pain control   Diabetes mellitus type 2 with hyperglycemia -A1c 14.7 -Continue insulin    History of gonorrhea History of syphilis -Treated with IM Rocephin 10/22/2021 -Follow-up with infectious disease -Started on Truvada    Discharge Instructions  Discharge Instructions     [facility] pharmacist to adjust dose for Vancomycin, Aminoglycosides and other anti-infective therapies as requested by physician.   Complete by: As directed    [facility] to provide Cath Flo 2mg    Complete by: As directed    Administer for PICC line occlusion and as ordered by physician for other access device issues.   Anaphylaxis Kit: Provided to treat any anaphylactic reaction to the medication being provided to the patient if First Dose or when requested by physician   Complete by: As directed    Epinephrine 1mg /ml vial / amp: Administer 0.3mg  (0.28ml) subcutaneously once for moderate to severe anaphylaxis, nurse to call physician and pharmacy when reaction occurs and call 911 if needed for immediate care   Diphenhydramine 50mg /ml IV vial: Administer 25-50mg  IV/IM PRN for first dose reaction, rash, itching, mild reaction, nurse to call physician and pharmacy when reaction occurs   Sodium Chloride 0.9% NS 565ml IV: Administer if needed for hypovolemic blood pressure drop or as ordered by physician after call to physician with anaphylactic reaction   Call MD for:  difficulty breathing, headache or visual disturbances   Complete by: As directed    Call MD for:  extreme fatigue   Complete by: As directed    Call MD for:  hives   Complete by: As directed    Call MD  for:  persistant dizziness or light-headedness   Complete by: As directed    Call MD for:  persistant nausea and vomiting   Complete by: As directed    Call MD for:  redness, tenderness, or signs of infection (pain, swelling, redness, odor or green/yellow discharge around  incision site)   Complete by: As directed    Call MD for:  severe uncontrolled pain   Complete by: As directed    Call MD for:  temperature >100.4   Complete by: As directed    Change dressing (specify)   Complete by: As directed    Please change drain site dressing daily as needed. Aquacel dressing to remain in place.   Change dressing on IV access line weekly and PRN   Complete by: As directed    Diet - low sodium heart healthy   Complete by: As directed    Discharge instructions   Complete by: As directed    You were cared for by a hospitalist during your hospital stay. If you have any questions about your discharge medications or the care you received while you were in the hospital after you are discharged, you can call the unit and ask to speak with the hospitalist on call if the hospitalist that took care of you is not available. Once you are discharged, your primary care physician will handle any further medical issues. Please note that NO REFILLS for any discharge medications will be authorized once you are discharged, as it is imperative that you return to your primary care physician (or establish a relationship with a primary care physician if you do not have one) for your aftercare needs so that they can reassess your need for medications and monitor your lab values.   Flush IV access with Sodium Chloride 0.9% and Heparin 10 units/ml or 100 units/ml   Complete by: As directed    Home infusion instructions - [facility]   Complete by: As directed    Instructions: Flush IV access with Sodium Chloride 0.9% and Heparin 10units/ml or 100units/ml   Change dressing on IV access line: Weekly and PRN   Instructions Cath Flo $Remove'2mg'REcudur$ : Administer for PICC Line occlusion and as ordered by physician for other access device   [facility] pharmacist to adjust dose for: Vancomycin, Aminoglycosides and other anti-infective therapies as requested by physician   Increase activity  slowly   Complete by: As directed    Method of administration may be changed at the discretion of home infusion pharmacist based upon assessment of the patient and/or caregiver's ability to self-administer the medication ordered   Complete by: As directed       Allergies as of 12/27/2021   No Known Allergies      Medication List     STOP taking these medications    insulin aspart 100 UNIT/ML FlexPen Commonly known as: NovoLOG FlexPen   Lantus SoloStar 100 UNIT/ML Solostar Pen Generic drug: insulin glargine   metroNIDAZOLE 500 MG tablet Commonly known as: FLAGYL       TAKE these medications    Accu-Chek FastClix Lancets Misc Check sugar 6 x daily   Accu-Chek Guide test strip Generic drug: glucose blood CHECK BLOOD SUGAR UP TO 6 TIMES PER DAY   acetone (urine) test strip Check ketones per protocol   aspirin 81 MG chewable tablet Commonly known as: Aspirin Childrens Chew 1 tablet (81 mg total) by mouth 2 (two) times daily with a meal.   BD Pen Needle  Nano U/F 32G X 4 MM Misc Generic drug: Insulin Pen Needle BD PEN NEEDLES- BRAND SPECIFIC. INJECT INSULIN VIA INSULIN PEN 6 X DAILY   Pen Needles 3/16" 31G X 5 MM Misc 1 each by Does not apply route as needed.   blood glucose meter kit and supplies Dispense based on patient and insurance preference. Use up to four times daily as directed. (FOR ICD-10 E10.9, E11.9).   cefTRIAXone  IVPB Commonly known as: ROCEPHIN Inject 2 g into the vein daily for 25 days. Indication:  Gonococcal septic arthritis First Dose: Yes Last Day of Therapy:  01/18/2022 Labs - Once weekly:  CBC/D and BMP, Labs - Every other week:  ESR and CRP Method of administration: IV Push Method of administration may be changed at the discretion of home infusion pharmacist based upon assessment of the patient and/or caregiver's ability to self-administer the medication ordered.   docusate sodium 100 MG capsule Commonly known as: COLACE Take 1 capsule  (100 mg total) by mouth 2 (two) times daily.   glucagon 1 MG injection Use for Severe Hypoglycemia . Inject 1 mg intramuscularly if unresponsive, unable to swallow, unconscious and/or has seizure   insulin degludec 100 UNIT/ML FlexTouch Pen Commonly known as: TRESIBA Inject up to 50 units into the skin daily What changed:  how much to take how to take this when to take this additional instructions   metFORMIN 1000 MG tablet Commonly known as: GLUCOPHAGE Take 0.5 tablets (500 mg total) by mouth 2 (two) times daily.   naproxen sodium 220 MG tablet Commonly known as: ALEVE Take 440 mg by mouth 2 (two) times daily as needed (pain).   NEXPLANON Laura Inject into the skin.   norgestimate-ethinyl estradiol 0.25-35 MG-MCG tablet Commonly known as: ORTHO-CYCLEN Take 1 tablet by mouth daily.   oxyCODONE 5 MG immediate release tablet Commonly known as: Roxicodone Take 1 tablet (5 mg total) by mouth every 4 (four) hours as needed for up to 7 days for severe pain.   traMADol 50 MG tablet Commonly known as: ULTRAM Take 1 tablet (50 mg total) by mouth every 6 (six) hours as needed for up to 7 days for severe pain.   Truvada 200-300 MG tablet Generic drug: emtricitabine-tenofovir Take 1 tablet by mouth daily.               Durable Medical Equipment  (From admission, onward)           Start     Ordered   12/22/21 1653  For home use only DME 3 n 1  Once        12/22/21 1652   12/22/21 1653  For home use only DME Walker rolling  Once       Question Answer Comment  Walker: With [place] Wheels   Patient needs a walker to treat with the following condition Gait instability      12/22/21 1652              Discharge Care Instructions  (From admission, onward)           Start     Ordered   12/27/21 0000  Change dressing (specify)       Comments: Please change drain site dressing daily as needed. Aquacel dressing to remain in place.   12/27/21 1053   12/26/21  0000  Change dressing on IV access line weekly and PRN  (Home infusion instructions - [facility] )  12/26/21 1137            Follow-up Information     Swinteck, Aaron Edelman, MD. Schedule an appointment as soon as possible for a visit in 11 day(s).   Specialty: Orthopedic Surgery Why: Please make an appointment for 2 weeks after your surgery date For suture removal, For wound re-check. Monday 01/05/22 or 01/06/22. Contact information: [address] [place] [address] [phone]         Laurice Record, MD. Go on 01/06/2022.   Specialty: Infectious Diseases Why: For PICC line management and antibiotic follow-up. Contact information: [address], [address]  [place] [address] [phone]         [facility] Follow up.   Why: Someone will call you to schedule first home visit. Contact information: [address] [place], [place] [address]  [phone] [place].  Follow up.   Why: Someone will call you to schedule first home visit. Contact information: [address] [place], [place] [address]  [phone]        Angeline Slim, MD. Schedule an appointment as soon as possible for a visit in 1 week(s).   Specialty: Pediatrics Contact information: [address] [place] [address] [phone]                No Known Allergies    Procedures/Studies: [place] EKG SITE RITE  Result Date: 12/24/2021 If Site Rite image not attached, placement could not be confirmed due to current cardiac rhythm.  CT Knee Left Wo Contrast  Result Date: 12/20/2021 CLINICAL DATA:  Knee pain, stress fracture suspected. EXAM: CT OF THE LEFT KNEE WITHOUT CONTRAST TECHNIQUE: Multidetector CT imaging of the left knee was performed according to the standard protocol. Multiplanar CT image reconstructions were also generated. RADIATION DOSE REDUCTION: This exam was  performed according to the departmental dose-optimization program which includes automated exposure control, adjustment of the mA and/or kV according to patient size and/or use of iterative reconstruction technique. COMPARISON:  None Available. FINDINGS: Bones/Joint/Cartilage No acute fracture or dislocation. Joint spaces maintained. There is a moderate joint effusion Ligaments Suboptimally assessed by CT. Muscles and Tendons No intramuscular edema or hematoma. The quadriceps and patellar tendons are intact. Soft tissues Mild subcutaneous fat stranding is noted anterior to the patella. IMPRESSION: 1. No acute fracture or dislocation. If there is clinical concern for stress fracture or internal derangement, MRI may be beneficial for further evaluation. 2. Moderate joint effusion. Electronically Signed   By: Brett Fairy M.D.   On: 12/20/2021 23:30   DG Knee Complete 4 Views Left  Result Date: 12/20/2021 CLINICAL DATA:  Increasing pain post injury. EXAM: LEFT KNEE - COMPLETE 4+ VIEW COMPARISON:  None Available. FINDINGS: No evidence of fracture, or dislocation. Suprapatellar joint effusion. No significant soft tissue swelling. IMPRESSION: 1. No fracture or dislocation identified about the left knee. 2. Suprapatellar joint effusion. The presence of suprapatellar joint effusion in a posttraumatic setting suggests radio occult fracture or internal derangement of the knee. Electronically Signed   By: Fidela Salisbury M.D.   On: 12/20/2021 17:09      Discharge Exam: Vitals:   12/27/21 0552 12/27/21 0909  BP: 117/78 132/75  Pulse: 97 99  Resp: 18 20  Temp: 98.1 F (36.7 C) 98.4 F (36.9 C)  SpO2: 100% 98%    General: Pt is alert, awake, not in acute distress Cardiovascular: RRR, S1/S2 +,  no edema Respiratory: CTA bilaterally, no wheezing, no rhonchi, no respiratory distress, no conversational dyspnea  Abdominal: Soft, NT, ND, bowel sounds + Extremities: Left knee with ACE bandage  Psych: Normal  mood and affect, stable judgement and insight     The results of significant diagnostics from this hospitalization (including imaging, microbiology, ancillary and laboratory) are listed below for reference.     Microbiology: Recent Results (from the past 240 hour(s))  Body fluid culture w Gram Stain     Status: None   Collection Time: 12/21/21 12:22 AM   Specimen: KNEE; Body Fluid  Result Value Ref Range Status   Specimen Description KNEE  Final   Special Requests NONE  Final   Gram Stain   Final    MODERATE WBC PRESENT,BOTH PMN AND MONONUCLEAR NO ORGANISMS SEEN    Culture   Final    RARE NEISSERIA GONORRHOEAE BETA LACTAMASE POSITIVE HEALTH DEPARTMENT NOTIFIED Performed at [facility], [address] [place], [place] [address]    Report Status 12/25/2021 FINAL  Final  Culture, blood (Routine X 2) w Reflex to ID Panel     Status: None   Collection Time: 12/21/21  5:00 AM   Specimen: BLOOD  Result Value Ref Range Status   Specimen Description BLOOD RIGHT ANTECUBITAL  Final   Special Requests   Final    BOTTLES DRAWN AEROBIC AND ANAEROBIC Blood Culture adequate volume   Culture   Final    NO GROWTH 5 DAYS Performed at [facility], [place] [address] [place], [place] [address]    Report Status 12/26/2021 FINAL  Final  Aerobic/Anaerobic Culture w Gram Stain (surgical/deep wound)     Status: None   Collection Time: 12/21/21 10:43 AM   Specimen: Synovium; Tissue  Result Value Ref Range Status   Specimen Description TISSUE  Final   Special Requests LEFT KNEE SYNOVIUM  Final   Gram Stain   Final    RARE WBC PRESENT,BOTH PMN AND MONONUCLEAR NO ORGANISMS SEEN    Culture   Final    No growth aerobically or anaerobically. Performed at [facility], [place] [address], [place], [place] [address]    Report Status 12/26/2021 FINAL  Final  Culture, blood (Routine X 2) w Reflex to ID Panel     Status: None   Collection Time: 12/21/21  2:16 PM   Specimen: BLOOD   Result Value Ref Range Status   Specimen Description BLOOD SITE NOT SPECIFIED  Final   Special Requests   Final    BOTTLES DRAWN AEROBIC ONLY Blood Culture results may not be optimal due to an inadequate volume of blood received in culture bottles   Culture   Final    NO GROWTH 5 DAYS Performed at [facility], [place] [address] [place], [place] [address]    Report Status 12/26/2021 FINAL  Final     Labs: BNP (last 3 results) No results for input(s): "BNP" in the last 8760 hours. Basic Metabolic Panel: Recent Labs  Lab 12/22/21 0031 12/23/21 0345 12/24/21 0802 12/25/21 0336 12/26/21 0457  NA 132* 135 139 140 139  K 3.7 3.4* 3.4* 3.4* 3.7  CL 102 103 101 101 98  CO2 20* $Remov'22 25 27 28  'pflyeF$ GLUCOSE 258* 202* 197* 141* 156*  BUN 6 5* <5* <5* 5*  CREATININE 0.62 0.48 0.35* 0.58 0.56  CALCIUM 8.5* 8.4* 8.9 9.0 9.1  MG 1.7  --  1.8  --  1.7  Liver Function Tests: Recent Labs  Lab 12/20/21 2255  AST 12*  ALT 15  ALKPHOS 104  BILITOT 0.9  PROT 7.6  ALBUMIN 3.2*   No results for input(s): "LIPASE", "AMYLASE" in the last 168 hours. No results for input(s): "AMMONIA" in the last 168 hours. CBC: Recent Labs  Lab 12/20/21 2255 12/21/21 0440 12/21/21 1416 12/22/21 0031 12/24/21 0802  WBC 13.9* 13.1* 12.0* 10.7* 7.9  NEUTROABS 9.0*  --   --  7.3  --   HGB 12.2 11.7* 11.4* 9.8* 9.6*  HCT 36.0 34.5* 33.4* 28.9* 27.7*  MCV 82.6 82.7 82.1 81.9 82.2  PLT 267 244 250 242 325   Cardiac Enzymes: No results for input(s): "CKTOTAL", "CKMB", "CKMBINDEX", "TROPONINI" in the last 168 hours. BNP: Invalid input(s): "POCBNP" CBG: Recent Labs  Lab 12/26/21 0751 12/26/21 1122 12/26/21 1628 12/26/21 2105 12/27/21 0936  GLUCAP 164* 216* 147* 176* 247*   D-Dimer No results for input(s): "DDIMER" in the last 72 hours. Hgb A1c No results for input(s): "HGBA1C" in the last 72 hours. Lipid Profile No results for input(s): "CHOL", "HDL", "LDLCALC", "TRIG", "CHOLHDL", "LDLDIRECT"  in the last 72 hours. Thyroid function studies No results for input(s): "TSH", "T4TOTAL", "T3FREE", "THYROIDAB" in the last 72 hours.  Invalid input(s): "FREET3" Anemia work up No results for input(s): "VITAMINB12", "FOLATE", "FERRITIN", "TIBC", "IRON", "RETICCTPCT" in the last 72 hours. Urinalysis    Component Value Date/Time   COLORURINE YELLOW 10/20/2021 1022   APPEARANCEUR CLOUDY (A) 10/20/2021 1022   LABSPEC 1.042 (H) 10/20/2021 1022   PHURINE 7.0 10/20/2021 1022   GLUCOSEU >=500 (A) 10/20/2021 1022   HGBUR LARGE (A) 10/20/2021 1022   BILIRUBINUR NEGATIVE 10/20/2021 1022   KETONESUR 20 (A) 10/20/2021 1022   PROTEINUR 30 (A) 10/20/2021 1022   UROBILINOGEN 0.2 10/30/2014 1831   NITRITE POSITIVE (A) 10/20/2021 1022   LEUKOCYTESUR TRACE (A) 10/20/2021 1022   Sepsis Labs Recent Labs  Lab 12/21/21 0440 12/21/21 1416 12/22/21 0031 12/24/21 0802  WBC 13.1* 12.0* 10.7* 7.9   Microbiology Recent Results (from the past 240 hour(s))  Body fluid culture w Gram Stain     Status: None   Collection Time: 12/21/21 12:22 AM   Specimen: KNEE; Body Fluid  Result Value Ref Range Status   Specimen Description KNEE  Final   Special Requests NONE  Final   Gram Stain   Final    MODERATE WBC PRESENT,BOTH PMN AND MONONUCLEAR NO ORGANISMS SEEN    Culture   Final    RARE NEISSERIA GONORRHOEAE BETA LACTAMASE [place] NOTIFIED Performed at [facility], [place] [address], [place], [place] [address]    Report Status 12/25/2021 FINAL  Final  Culture, blood (Routine X 2) w Reflex to ID Panel     Status: None   Collection Time: 12/21/21  5:00 AM   Specimen: BLOOD  Result Value Ref Range Status   Specimen Description BLOOD RIGHT ANTECUBITAL  Final   Special Requests   Final    BOTTLES DRAWN AEROBIC AND ANAEROBIC Blood Culture adequate volume   Culture   Final    NO GROWTH 5 DAYS Performed at [facility], [place] [address], [place], [place] [address]    Report  Status 12/26/2021 FINAL  Final  Aerobic/Anaerobic Culture w Gram Stain (surgical/deep wound)     Status: None   Collection Time: 12/21/21 10:43 AM   Specimen: Synovium; Tissue  Result Value Ref Range Status   Specimen Description TISSUE  Final  Special Requests LEFT KNEE SYNOVIUM  Final   Gram Stain   Final    RARE WBC PRESENT,BOTH PMN AND MONONUCLEAR NO ORGANISMS SEEN    Culture   Final    No growth aerobically or anaerobically. Performed at [facility], [place] [address], [place], [place] [address]    Report Status 12/26/2021 FINAL  Final  Culture, blood (Routine X 2) w Reflex to ID Panel     Status: None   Collection Time: 12/21/21  2:16 PM   Specimen: BLOOD  Result Value Ref Range Status   Specimen Description BLOOD SITE NOT SPECIFIED  Final   Special Requests   Final    BOTTLES DRAWN AEROBIC ONLY Blood Culture results may not be optimal due to an inadequate volume of blood received in culture bottles   Culture   Final    NO GROWTH 5 DAYS Performed at [facility], [place] [address] [place],  [address]    Report Status 12/26/2021 FINAL  Final     Patient was seen and examined on the day of discharge and was found to be in stable condition. Time coordinating discharge: 25 minutes including assessment and coordination of care, as well as examination of the patient.   SIGNED:  Dessa Phi, DO [facility] 12/27/2021, 10:54 AM

## 2022-01-06 ENCOUNTER — Inpatient Hospital Stay: Payer: Medicaid Other | Admitting: Internal Medicine

## 2022-01-06 NOTE — Progress Notes (Deleted)
Patient Active Problem List   Diagnosis Date Noted   Hypokalemia 12/23/2021   Septic joint of left knee joint ([place]) 12/21/2021   h/o Gonorrhea 12/21/2021   Hyponatremia 12/21/2021   Elevated hemoglobin A1c 04/26/2018   Peripheral neuropathy 04/26/2018   Abscess 04/05/2018   Fever    Gluteal abscess    Pilonidal cyst with abscess 04/04/2018   Severe obesity due to excess calories with serious comorbidity and body mass index (BMI) greater than 99th percentile for age in pediatric patient ([place]) 08/16/2017   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/16/2017   Maladaptive health behaviors affecting medical condition 11/02/2016   Morbid obesity ([place]) 08/03/2016   Acanthosis nigricans 08/03/2016   Non compliance w medication regimen    Dehydration    Ketonuria    Hyperglycemia 10/30/2014   Type 2 diabetes mellitus with hyperglycemia, with long-term current use of insulin ([place]) 10/30/2014   Anxiety     Patient's Medications  New Prescriptions   No medications on file  Previous Medications   ACCU-CHEK FASTCLIX LANCETS MISC    Check sugar 6 x daily   ACCU-CHEK GUIDE TEST STRIP    CHECK BLOOD SUGAR UP TO 6 TIMES PER DAY   ACETONE, URINE, TEST STRIP    Check ketones per protocol   ASPIRIN (ASPIRIN CHILDRENS) 81 MG CHEWABLE TABLET    Chew 1 tablet (81 mg total) by mouth 2 (two) times daily with a meal.   BLOOD GLUCOSE METER KIT AND SUPPLIES    Dispense based on patient and insurance preference. Use up to four times daily as directed. (FOR ICD-10 E10.9, E11.9).   CEFTRIAXONE (ROCEPHIN) IVPB    Inject 2 g into the vein daily for 25 days. Indication:  Gonococcal septic arthritis First Dose: Yes Last Day of Therapy:  01/18/2022 Labs - Once weekly:  CBC/D and BMP, Labs - Every other week:  ESR and CRP Method of administration: IV Push Method of administration may be changed at the discretion of home infusion pharmacist based upon assessment of the patient and/or caregiver's ability to  self-administer the medication ordered.   DOCUSATE SODIUM (COLACE) 100 MG CAPSULE    Take 1 capsule (100 mg total) by mouth 2 (two) times daily.   EMTRICITABINE-TENOFOVIR (TRUVADA) 200-300 MG TABLET    Take 1 tablet by mouth daily.   ETONOGESTREL (NEXPLANON [place])    Inject into the skin.   GLUCAGON 1 MG INJECTION    Use for Severe Hypoglycemia . Inject 1 mg intramuscularly if unresponsive, unable to swallow, unconscious and/or has seizure   INSULIN DEGLUDEC (TRESIBA) 100 UNIT/ML SOPN FLEXTOUCH PEN    Inject up to 50 units into the skin daily   INSULIN PEN NEEDLE (BD PEN NEEDLE NANO U/F) 32G X 4 MM MISC    BD PEN NEEDLES- BRAND SPECIFIC. INJECT INSULIN VIA INSULIN PEN 6 X DAILY   INSULIN PEN NEEDLE (PEN NEEDLES 3/16") 31G X 5 MM MISC    1 each by Does not apply route as needed.   METFORMIN (GLUCOPHAGE) 1000 MG TABLET    Take 0.5 tablets (500 mg total) by mouth 2 (two) times daily.   NAPROXEN SODIUM (ALEVE) 220 MG TABLET    Take 440 mg by mouth 2 (two) times daily as needed (pain).   NORGESTIMATE-ETHINYL ESTRADIOL (ORTHO-CYCLEN,SPRINTEC,PREVIFEM) 0.25-35 MG-MCG TABLET    Take 1 tablet by mouth daily.  Modified Medications   No medications on file  Discontinued Medications   No medications  on file    Subjective: 21 year old female with past medical history as below presents for hospital follow-up.  She was admitted to [facility] 7/9 - 7/15 for left knee septic arthritis.  She had bumped her knee and had developed swelling.  Presented to ED due to chills and worsening pain.  CT the left knee showed moderate joint effusion.  Left knee aspirate noted 5 cc of purulent yellow fluid with cultures growing Neisseria gonorrhea, health department notified.  Taken to the OR on 7/9 with Dr. Lyla Glassing patient underwent open arthrotomy and drainage of left knee with cultures no growth x3 days.  Per OR notes there was "copious amount of purulent drainage upon entering the joint.  She was discharged on ceftriaxone x4  weeks IV from the OR EOT 8/19. She has a history of Neisseria gonorrhea on 10/20/2021 status posttreatment.  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  HIV negative on 5/8.  She was trialed on Truvada for PrEP.  Antibiotics:   Cefepime 7/8 Ceftriaxone 7/8-p  Vanc 7/8-7/11 Cultures: Blood 7/9 ng  Review of Systems: ROS  Past Medical History:  Diagnosis Date   Anxiety    Diabetes mellitus, type 2 ([place])    Diagnosed 10/2014, hbA1c 14.1% at diagnosis.  Started on insulin and metformin   Heart murmur     Social History   Tobacco Use   Smoking status: Never    Passive exposure: Yes   Smokeless tobacco: Never  Vaping Use   Vaping Use: Former   Start date: 04/06/2015   Quit date: 12/04/2017  Substance Use Topics   Alcohol use: No   Drug use: Yes    Types: Marijuana    Comment: pt states she smokes once a month    Family History  Problem Relation Age of Onset   Kidney disease Father    Diabetes Father        Reported as type 1 diabetes   Diabetes Maternal Aunt        type 2 diabetes, was on oral meds, now diet controlled   Thyroid disease Maternal Grandmother     No Known Allergies  Health Maintenance  Topic Date Due   COVID-19 Vaccine (1) Never done   FOOT EXAM  Never done   OPHTHALMOLOGY EXAM  Never done   HPV VACCINES (1 - Risk 3-dose series) Never done   URINE MICROALBUMIN  11/12/2018   PAP-Cervical Cytology Screening  Never done   PAP SMEAR-Modifier  Never done   TETANUS/TDAP  12/20/2021   INFLUENZA VACCINE  01/13/2022   HEMOGLOBIN A1C  06/25/2022   CHLAMYDIA SCREENING  10/21/2022   Hepatitis C Screening  Completed   HIV Screening  Completed    Objective:  There were no vitals filed for this visit. There is no height or weight on file to calculate BMI.  Physical Exam  Lab Results Lab Results  Component Value Date   WBC 7.9 12/24/2021   HGB 9.6 (L) 12/24/2021   HCT 27.7 (L) 12/24/2021   MCV 82.2 12/24/2021   PLT  325 12/24/2021    Lab Results  Component Value Date   CREATININE 0.56 12/26/2021   BUN 5 (L) 12/26/2021   NA 139 12/26/2021   K 3.7 12/26/2021   CL 98 12/26/2021   CO2 28 12/26/2021    Lab Results  Component Value Date   ALT 15 12/20/2021   AST 12 (L) 12/20/2021   ALKPHOS 104 12/20/2021  BILITOT 0.9 12/20/2021    Lab Results  Component Value Date   CHOL 212 (H) 11/11/2017   HDL 33 (L) 11/11/2017   LDLCALC 152 (H) 11/11/2017   TRIG 138 (H) 11/11/2017   CHOLHDL 6.4 (H) 11/11/2017   Lab Results  Component Value Date   LABRPR Reactive (A) 12/21/2021   HIV 1 RNA Quant (copies/mL)  Date Value  12/23/2021 <20     A/P #Left knee septic arthritis 2/2 Neisseria gonorrhea from left knee aspirate #Hx gonorrhea on 5/8 /23 #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 Developed swelling. She presented to ED on 7/8 due to chills and worsening pain. -CT left knee showed moderate joint effusion.  Left knee was aspirated noted to have 5 cc of purulent/yellow fluid, Cx+ Neisseria gonorrhea, HD notified) -Taken to the OR with Dr. Lyla Glassing on 7/9 where patient underwent open arthrotomy and drainage of left knee, Cx NGx3d.  Per OR note there was  copious amount of grossly purulent fluid upon entering the joint.    Recommendations: / -Continue ceftriaxone, will need 4 weeks of IV antibiotics from OR EOT 8/19   #PrEP -RPR 1:16 on 10/30/20  and pt received PENG. 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 RPR on 5/8 1:2 -RPR 1:1, c/w previously treated syphilis -HIV Reccs -F/U GC urine, HCV -Agreeable to  PREP -Start Truvada once HIV returns negative -Follow-up in ID clinic on 8/5       Laurice Record, MD [place] [facility] 01/06/2022, 8:52 AM

## 2022-01-08 ENCOUNTER — Telehealth: Payer: Self-pay | Admitting: Pharmacist

## 2022-01-08 ENCOUNTER — Encounter: Payer: Self-pay | Admitting: Internal Medicine

## 2022-01-08 ENCOUNTER — Ambulatory Visit (INDEPENDENT_AMBULATORY_CARE_PROVIDER_SITE_OTHER): Payer: Medicaid Other | Admitting: Internal Medicine

## 2022-01-08 ENCOUNTER — Other Ambulatory Visit (HOSPITAL_COMMUNITY): Payer: Self-pay

## 2022-01-08 ENCOUNTER — Other Ambulatory Visit: Payer: Self-pay

## 2022-01-08 VITALS — BP 112/76 | HR 91 | Temp 98.0°F | Wt 204.0 lb

## 2022-01-08 DIAGNOSIS — A64 Unspecified sexually transmitted disease: Secondary | ICD-10-CM | POA: Diagnosis present

## 2022-01-08 NOTE — Telephone Encounter (Signed)
Counseled that Apretude is one intramuscular injection in the gluteal muscle for each visit. Explained that the second injection is 30 days after the initial injection then every 2 months thereafter. Discussed follow up appointments moving forward. It is required to have a negative HIV test immediately prior to injection administration. A rapid HIV blood test will be drawn each visit, and patient must wait for results before getting injection, which typically takes 20-30 minutes. Will make lab appointments for each injection 30 minutes prior to injection appointment. Screened patient for acute HIV symptoms such as fatigue, muscle aches, rash, sore throat, lymphadenopathy, headache, night sweats, nausea/vomiting/diarrhea, and fever. Patient denies any symptoms.   Explained that showing up to injection appointments is very important and warned that if appointments are missed, protection will be minimal and the risk of acquiring HIV becomes much higher. Counseled on possible side effects associated with the injections such as injection site pain, which is usually mild to moderate in nature, injection site nodules, and injection site reactions. Asked to call the clinic or send me a mychart message if they experience any issues. Advised that they can take Motrin or Tylenol for injection site pain if needed. They may also pre-treat with Motrin or Tylenol about 30-45 minutes before scheduled appointments.    Lupita Leash will look into insurance  coverage.  Margarite Gouge, PharmD, CPP, BCIDP Clinical Pharmacist Practitioner Infectious Diseases Clinical Pharmacist [facility]

## 2022-01-08 NOTE — Progress Notes (Signed)
Labs drawn via PICC line per Dr. Singh. Line flushed with 10 mL normal saline and clamped. Patient tolerated procedure well.    Hughes Wyndham D Latisha Lasch, RN  

## 2022-01-08 NOTE — Progress Notes (Signed)
Patient Active Problem List   Diagnosis Date Noted   Hypokalemia 12/23/2021   Septic joint of left knee joint ([place]) 12/21/2021   h/o Gonorrhea 12/21/2021   Hyponatremia 12/21/2021   Elevated hemoglobin A1c 04/26/2018   Peripheral neuropathy 04/26/2018   Abscess 04/05/2018   Fever    Gluteal abscess    Pilonidal cyst with abscess 04/04/2018   Severe obesity due to excess calories with serious comorbidity and body mass index (BMI) greater than 99th percentile for age in pediatric patient ([place]) 08/16/2017   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/16/2017   Maladaptive health behaviors affecting medical condition 11/02/2016   Morbid obesity ([place]) 08/03/2016   Acanthosis nigricans 08/03/2016   Non compliance w medication regimen    Dehydration    Ketonuria    Hyperglycemia 10/30/2014   Type 2 diabetes mellitus with hyperglycemia, with long-term current use of insulin ([place]) 10/30/2014   Anxiety     Patient's Medications  New Prescriptions   No medications on file  Previous Medications   ACCU-CHEK FASTCLIX LANCETS MISC    Check sugar 6 x daily   ACCU-CHEK GUIDE TEST STRIP    CHECK BLOOD SUGAR UP TO 6 TIMES PER DAY   ACETONE, URINE, TEST STRIP    Check ketones per protocol   ASPIRIN (ASPIRIN CHILDRENS) 81 MG CHEWABLE TABLET    Chew 1 tablet (81 mg total) by mouth 2 (two) times daily with a meal.   BLOOD GLUCOSE METER KIT AND SUPPLIES    Dispense based on patient and insurance preference. Use up to four times daily as directed. (FOR ICD-10 E10.9, E11.9).   CEFTRIAXONE (ROCEPHIN) IVPB    Inject 2 g into the vein daily for 25 days. Indication:  Gonococcal septic arthritis First Dose: Yes Last Day of Therapy:  01/18/2022 Labs - Once weekly:  CBC/D and BMP, Labs - Every other week:  ESR and CRP Method of administration: IV Push Method of administration may be changed at the discretion of home infusion pharmacist based upon assessment of the patient and/or caregiver's ability to  self-administer the medication ordered.   DOCUSATE SODIUM (COLACE) 100 MG CAPSULE    Take 1 capsule (100 mg total) by mouth 2 (two) times daily.   EMTRICITABINE-TENOFOVIR (TRUVADA) 200-300 MG TABLET    Take 1 tablet by mouth daily.   ETONOGESTREL (NEXPLANON [place])    Inject into the skin.   GLUCAGON 1 MG INJECTION    Use for Severe Hypoglycemia . Inject 1 mg intramuscularly if unresponsive, unable to swallow, unconscious and/or has seizure   INSULIN DEGLUDEC (TRESIBA) 100 UNIT/ML SOPN FLEXTOUCH PEN    Inject up to 50 units into the skin daily   INSULIN PEN NEEDLE (BD PEN NEEDLE NANO U/F) 32G X 4 MM MISC    BD PEN NEEDLES- BRAND SPECIFIC. INJECT INSULIN VIA INSULIN PEN 6 X DAILY   INSULIN PEN NEEDLE (PEN NEEDLES 3/16") 31G X 5 MM MISC    1 each by Does not apply route as needed.   METFORMIN (GLUCOPHAGE) 1000 MG TABLET    Take 0.5 tablets (500 mg total) by mouth 2 (two) times daily.   NAPROXEN SODIUM (ALEVE) 220 MG TABLET    Take 440 mg by mouth 2 (two) times daily as needed (pain).   NORGESTIMATE-ETHINYL ESTRADIOL (ORTHO-CYCLEN,SPRINTEC,PREVIFEM) 0.25-35 MG-MCG TABLET    Take 1 tablet by mouth daily.  Modified Medications   No medications on file  Discontinued Medications   No medications  on file    Subjective: 21 year old female with history of gonorrhea, chlamydia, syphilis presents for hospital follow-up.  She was admitted to [facility] from 7/9 - 7/15 for left knee septic arthritis.  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  Subsequently developed swelling and tenderness.  CT on admission showed left knee moderate joint effusion.  Left knee was aspirated with cultures growing Neisseria gonorrhea and Health Department was notified.  She was taken to the OR with Dr. Lyla Glassing on 7/9 she underwent open arthrotomy and drainage of left knee with negative cultures. Per OR note there was copious amount of grossly purulent  fluid upon entering the joint.  She was discharged on ceftriaxone x4 weeks EOT.  Given his history of STIs including syphilis, gonorrhea, chlamydia she was agreeable to start PrEP with Truvada.  Today 7/27: She reports she did not start PrEP as is interested in injectables. Denies being sexually active since discharge from hospital.  Reports knee is still somewhat painful, she plan as on starting PT. Denies fever and chills. She has not returned to work.  She reports 100% adherence to ceftriaxone. Review of Systems: Review of Systems  All other systems reviewed and are negative.   Past Medical History:  Diagnosis Date   Anxiety    Diabetes mellitus, type 2 ([place])    Diagnosed 10/2014, hbA1c 14.1% at diagnosis.  Started on insulin and metformin   Heart murmur     Social History   Tobacco Use   Smoking status: Never    Passive exposure: Yes   Smokeless tobacco: Never  Vaping Use   Vaping Use: Former   Start date: 04/06/2015   Quit date: 12/04/2017  Substance Use Topics   Alcohol use: No   Drug use: Yes    Types: Marijuana    Comment: pt states she smokes once a month    Family History  Problem Relation Age of Onset   Kidney disease Father    Diabetes Father        Reported as type 1 diabetes   Diabetes Maternal Aunt        type 2 diabetes, was on oral meds, now diet controlled   Thyroid disease Maternal Grandmother     No Known Allergies  Health Maintenance  Topic Date Due   COVID-19 Vaccine (1) Never done   FOOT EXAM  Never done   OPHTHALMOLOGY EXAM  Never done   HPV VACCINES (1 - Risk 3-dose series) Never done   URINE MICROALBUMIN  11/12/2018   PAP-Cervical Cytology Screening  Never done   PAP SMEAR-Modifier  Never done   TETANUS/TDAP  12/20/2021   INFLUENZA VACCINE  01/13/2022   HEMOGLOBIN A1C  06/25/2022   CHLAMYDIA SCREENING  10/21/2022   Hepatitis C Screening  Completed   HIV Screening  Completed    Objective:  There were no vitals filed for this  visit. There is no height or weight on file to calculate BMI.  Physical Exam Constitutional:      Appearance: Normal appearance.  HENT:     Head: Normocephalic and atraumatic.     Right Ear: Tympanic membrane normal.     Left Ear: Tympanic membrane normal.     Nose: Nose normal.     Mouth/Throat:     Mouth: Mucous membranes are moist.  Eyes:     Extraocular Movements: Extraocular movements intact.     Conjunctiva/sclera: Conjunctivae  normal.     Pupils: Pupils are equal, round, and reactive to light.  Cardiovascular:     Rate and Rhythm: Normal rate and regular rhythm.     Heart sounds: No murmur heard.    No friction rub. No gallop.     Comments: RUE PICC Pulmonary:     Effort: Pulmonary effort is normal.     Breath sounds: Normal breath sounds.  Abdominal:     General: Abdomen is flat.     Palpations: Abdomen is soft.  Musculoskeletal:        General: Normal range of motion.  Skin:    General: Skin is warm and dry.  Neurological:     General: No focal deficit present.     Mental Status: She is alert and oriented to person, place, and time.  Psychiatric:        Mood and Affect: Mood normal.     Lab Results Lab Results  Component Value Date   WBC 7.9 12/24/2021   HGB 9.6 (L) 12/24/2021   HCT 27.7 (L) 12/24/2021   MCV 82.2 12/24/2021   PLT 325 12/24/2021    Lab Results  Component Value Date   CREATININE 0.56 12/26/2021   BUN 5 (L) 12/26/2021   NA 139 12/26/2021   K 3.7 12/26/2021   CL 98 12/26/2021   CO2 28 12/26/2021    Lab Results  Component Value Date   ALT 15 12/20/2021   AST 12 (L) 12/20/2021   ALKPHOS 104 12/20/2021   BILITOT 0.9 12/20/2021    Lab Results  Component Value Date   CHOL 212 (H) 11/11/2017   HDL 33 (L) 11/11/2017   LDLCALC 152 (H) 11/11/2017   TRIG 138 (H) 11/11/2017   CHOLHDL 6.4 (H) 11/11/2017   Lab Results  Component Value Date   LABRPR Reactive (A) 12/21/2021   HIV 1 RNA Quant (copies/mL)  Date Value  12/23/2021 <20      A/P #Left knee septic arthritis 2/2 Neisseria gonorrhea from left knee aspirate on 7/9 #SP open arthrotomy on 7/9 with negative Cx #Hx gonorrhea on 5/8 /23 #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 Plan to start PT.  -Follow-up in one month at end of treatment.   #PreP -HIV negative on 10/20/21. Ordered updated HIV, Urine cytology -HCV NR on 12/22/21. HAV and HBV immunization complete. Will order Ab testing for immunity.  -Has not started Truvada, interested in injectable. She does want to start Truvada. Plan -Discussed injectable, pt spoke with Pharmacy and will go through approval process for injectables.    #Uncontrolled DM on insulin -A1C 14.7 -Needs glycemic control for optimal healing  I spent more than 45 minutes for this patient encounter including reviewing data/chart, and coordinating care and >50% direct face to face time providing counseling/discussing diagnostics/treatment plan with patient    Laurice Record, [place] [facility] 01/08/2022, 11:20 AM

## 2022-01-09 LAB — HEPATITIS B SURFACE ANTIBODY,QUALITATIVE: Hep B S Ab: NONREACTIVE

## 2022-01-09 LAB — HIV ANTIBODY (ROUTINE TESTING W REFLEX): HIV 1&2 Ab, 4th Generation: NONREACTIVE

## 2022-01-09 LAB — HEPATITIS A ANTIBODY, TOTAL: Hepatitis A AB,Total: REACTIVE — AB

## 2022-01-26 ENCOUNTER — Encounter: Payer: Self-pay | Admitting: Pharmacist

## 2022-02-02 ENCOUNTER — Telehealth: Payer: Self-pay

## 2022-02-02 NOTE — Telephone Encounter (Signed)
Home health sent message asking for pull PICC orders. Will route to provider.   Sandie Ano, RN

## 2022-02-03 NOTE — Telephone Encounter (Signed)
Relayed pull PICC orders to Jeri Modena, RN with [facility].   Sandie Ano, RN

## 2022-02-10 ENCOUNTER — Telehealth: Payer: Self-pay

## 2022-02-10 ENCOUNTER — Ambulatory Visit: Payer: Medicaid Other | Admitting: Internal Medicine

## 2022-02-10 NOTE — Telephone Encounter (Signed)
Called patient to reschedule missed appointment. No answer. Voicemailbox not set up; unable to leave message. Will send MyChart message.  Wyvonne Lenz, RN

## 2022-02-20 ENCOUNTER — Encounter: Payer: Self-pay | Admitting: Internal Medicine

## 2022-07-09 ENCOUNTER — Other Ambulatory Visit: Payer: Self-pay | Admitting: Internal Medicine

## 2022-07-09 ENCOUNTER — Encounter: Payer: Self-pay | Admitting: Internal Medicine

## 2022-07-09 ENCOUNTER — Ambulatory Visit (INDEPENDENT_AMBULATORY_CARE_PROVIDER_SITE_OTHER): Payer: Medicaid Other | Admitting: Internal Medicine

## 2022-07-09 VITALS — BP 124/76 | HR 67 | Ht 71.0 in | Wt 212.0 lb

## 2022-07-09 DIAGNOSIS — E1165 Type 2 diabetes mellitus with hyperglycemia: Secondary | ICD-10-CM

## 2022-07-09 DIAGNOSIS — E1142 Type 2 diabetes mellitus with diabetic polyneuropathy: Secondary | ICD-10-CM

## 2022-07-09 DIAGNOSIS — Z794 Long term (current) use of insulin: Secondary | ICD-10-CM

## 2022-07-09 LAB — POCT GLUCOSE (DEVICE FOR HOME USE): Glucose Fasting, POC: 315 mg/dL — AB (ref 70–99)

## 2022-07-09 LAB — POCT GLYCOSYLATED HEMOGLOBIN (HGB A1C): Hemoglobin A1C: 14.9 % — AB (ref 4.0–5.6)

## 2022-07-09 MED ORDER — TRULICITY 0.75 MG/0.5ML ~~LOC~~ SOAJ: 0.7500 mg | SUBCUTANEOUS | 2 refills | Status: AC

## 2022-07-09 MED ORDER — METFORMIN HCL 1000 MG PO TABS: 1000.0000 mg | ORAL_TABLET | Freq: Two times a day (BID) | ORAL | 3 refills | Status: AC

## 2022-07-09 MED ORDER — "PEN NEEDLES 3/16"" 31G X 5 MM MISC": 1.0000 | Freq: Four times a day (QID) | 3 refills | Status: AC

## 2022-07-09 MED ORDER — GLUCAGON (RDNA) 1 MG IJ KIT: 1.0000 mg | PACK | Freq: Once | INTRAMUSCULAR | 3 refills | Status: AC | PRN

## 2022-07-09 MED ORDER — INSULIN DEGLUDEC 100 UNIT/ML ~~LOC~~ SOPN
28.0000 [IU] | PEN_INJECTOR | Freq: Every day | SUBCUTANEOUS | 2 refills | Status: DC
Start: 2022-07-09 — End: 2022-07-13

## 2022-07-09 MED ORDER — NOVOLOG FLEXPEN 100 UNIT/ML ~~LOC~~ SOPN: PEN_INJECTOR | SUBCUTANEOUS | 2 refills | Status: AC

## 2022-07-09 MED ORDER — ACCU-CHEK GUIDE VI STRP: 1.0000 | ORAL_STRIP | Freq: Three times a day (TID) | 3 refills | Status: AC

## 2022-07-09 MED ORDER — ACCU-CHEK GUIDE ME W/DEVICE KIT: 1.0000 | PACK | Freq: Three times a day (TID) | 0 refills | Status: AC

## 2022-07-09 NOTE — Patient Instructions (Signed)
Continue Metformin 1000 mg twice daily  Increase Tresiba 28 units daily  Start Trulicity 1.44 mg once weekly  Novolog correctional insulin: Use the scale below to help guide you before each meal   Blood sugar before meal Number of units to inject  Less than 160 0 unit  161 -  190 1 units  191 -  220 2 units  221 -  250 3 units  251 -  280 4 units  281 -  310 5 units  311 -  340 6 units  341 -  370 7 units  371 -  400 8 units  401- 430 9 units     HOW TO TREAT LOW BLOOD SUGARS (Blood sugar LESS THAN 70 MG/DL) Please follow the RULE OF 15 for the treatment of hypoglycemia treatment (when your (blood sugars are less than 70 mg/dL)   STEP 1: Take 15 grams of carbohydrates when your blood sugar is low, which includes:  3-4 GLUCOSE TABS  OR 3-4 OZ OF JUICE OR REGULAR SODA OR ONE TUBE OF GLUCOSE GEL    STEP 2: RECHECK blood sugar in 15 MINUTES STEP 3: If your blood sugar is still low at the 15 minute recheck --> then, go back to STEP 1 and treat AGAIN with another 15 grams of carbohydrates.

## 2022-07-09 NOTE — Progress Notes (Signed)
Name: Danielle Norman  MRN/ DOB: [id number], [birth year]   Age/ Sex: 22 y.o., female    PCP: Angeline Slim, MD   Reason for Endocrinology Evaluation: Type 2 Diabetes Mellitus     Date of Initial Endocrinology Visit: 07/09/2022     PATIENT IDENTIFIER: Danielle Norman is a 22 y.o. female with a past medical history of DM,. The patient presented for initial endocrinology clinic visit on 07/09/2022 for consultative assistance with her diabetes management.    HPI: Danielle Norman was    Diagnosed with DM 2016 Prior Medications tried/Intolerance: as listed  Currently checking blood sugars 0 x / day,  before breakfast .   Hemoglobin A1c has ranged from 6.5% in 2016, peaking at 14.7% in 2023.  In terms of diet, the patient eats twice a day, snacks 1x a day. 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  Started on insulin and metformin   Heart murmur    Past Surgical History:  Past Surgical History:  Procedure Laterality Date   ABCESS DRAINAGE     buttucks   EYE MUSCLE SURGERY     I & D EXTREMITY Left 12/21/2021   Procedure: IRRIGATION AND DEBRIDEMENT, KNEE;  Surgeon: Rod Can, MD;  Location: [facility];  Service: Orthopedics;  Laterality: Left;    Social History:  reports that  she has never smoked. She has been exposed to tobacco smoke. She has never used smokeless tobacco. She reports current drug use. Drug: Marijuana. She reports that she does not drink alcohol. Family History:  Family History  Problem Relation Age of Onset   Kidney disease Father    Diabetes Father        Reported as type 1 diabetes   Diabetes Maternal Aunt        type 2 diabetes, was on oral meds, now diet controlled   Thyroid disease Maternal Grandmother      HOME MEDICATIONS: Allergies as of 07/09/2022   No Known Allergies      Medication List        Accurate as of July 09, 2022  9:59 AM. If you have any questions, ask your nurse or doctor.          STOP taking these medications    norgestimate-ethinyl estradiol 0.25-35 MG-MCG tablet Commonly known as: ORTHO-CYCLEN Stopped by: Dorita Sciara, MD       TAKE these medications    Accu-Chek FastClix Lancets Misc Check sugar 6 x daily   Accu-Chek Guide test strip Generic drug: glucose blood CHECK BLOOD SUGAR UP TO 6 TIMES PER DAY   acetone (urine) test strip Check ketones per protocol   BD Pen Needle Nano U/F 32G X 4 MM Misc Generic drug: Insulin Pen  Needle BD PEN NEEDLES- BRAND SPECIFIC. INJECT INSULIN VIA INSULIN PEN 6 X DAILY   Pen Needles 3/16" 31G X 5 MM Misc 1 each by Does not apply route as needed.   blood glucose meter kit and supplies Dispense based on patient and insurance preference. Use up to four times daily as directed. (FOR ICD-10 E10.9, E11.9).   docusate sodium 100 MG capsule Commonly known as: COLACE Take 1 capsule (100 mg total) by mouth 2 (two) times daily.   glucagon 1 MG injection Use for Severe Hypoglycemia . Inject 1 mg intramuscularly if unresponsive, unable to swallow, unconscious and/or has seizure   insulin degludec 100 UNIT/ML FlexTouch Pen Commonly known as: TRESIBA Inject up to 50 units into the skin daily   metFORMIN 1000 MG tablet Commonly known as:  GLUCOPHAGE Take 0.5 tablets (500 mg total) by mouth 2 (two) times daily.   naproxen sodium 220 MG tablet Commonly known as: ALEVE Take 440 mg by mouth 2 (two) times daily as needed (pain).   NEXPLANON [place] Inject into the skin.   Truvada 200-300 MG tablet Generic drug: emtricitabine-tenofovir Take 1 tablet by mouth daily.         ALLERGIES: No Known Allergies   REVIEW OF SYSTEMS: A comprehensive ROS was conducted with the patient and is negative except as per HPI    OBJECTIVE:   VITAL SIGNS: BP 124/76 (BP Location: Left Arm, Patient Position: Sitting, Cuff Size: Large)   Pulse 67   Ht 5\' 11"  (1.803 m)   Wt 212 lb (96.2 kg)   SpO2 94%   BMI 29.57 kg/m    PHYSICAL EXAM:  General: Pt appears well and is in NAD  Neck: General: Supple without adenopathy or carotid bruits. Thyroid: Thyroid size normal.  No goiter or nodules appreciated.   Lungs: Clear with good BS bilat with no rales, rhonchi, or wheezes  Heart: RRR   Abdomen:  soft, nontender  Extremities:  Lower extremities - No pretibial edema.   Neuro: MS is good with appropriate affect, pt is alert and Ox3    DM foot exam: 07/09/2022  The skin of the feet is intact without sores or ulcerations. The pedal pulses are 2+ on right and 2+ on left. The sensation is intact to a screening 5.07, 10 gram monofilament bilaterally    DATA REVIEWED:  Lab Results  Component Value Date   HGBA1C 14.9 (A) 07/09/2022   HGBA1C 14.7 (H) 12/23/2021   HGBA1C 11.5 (A) 04/26/2018    Latest Reference Range & Units 12/26/21 04:57  Sodium 135 - 145 mmol/L 139  Potassium 3.5 - 5.1 mmol/L 3.7  Chloride 98 - 111 mmol/L 98  CO2 22 - 32 mmol/L 28  Glucose 70 - 99 mg/dL 156 (H)  BUN 6 - 20 mg/dL 5 (L)  Creatinine 0.44 - 1.00 mg/dL 0.56  Calcium 8.9 - 10.3 mg/dL 9.1  Anion gap 5 - 15  13  Magnesium 1.7 - 2.4 mg/dL 1.7  GFR, Estimated >60 mL/min >60  (H): Data is abnormally high (L): Data is abnormally low   ASSESSMENT / PLAN  / RECOMMENDATIONS:   1) Type 2 Diabetes Mellitus, Poorly controlled, With neuropathic  complications - Most recent A1c of 14.9 %. Goal A1c < 7.0 %.    Plan: GENERAL: I have discussed with the patient the pathophysiology of diabetes. We went over the natural progression of the disease. We stressed the importance of lifestyle changes. I explained the complications associated with diabetes including retinopathy,  nephropathy, neuropathy as well as increased risk of cardiovascular disease. We went over the benefit seen with glycemic control.  I explained to the patient that diabetic patients are at higher than normal risk for amputations.  Prescribed dexcom and a sample provided  Will start novolog as below before each meal  Will start trulicity, cautioned against GI side effects, denies hx of pancreatitis  A prescription for Accu-Chek guide and extra strips have also been sent to the pharmacy Preconception counseling was done, patient to avoid pregnancy unless A1c <7.0% she has no plans of conceiving in the near future  MEDICATIONS: Continue metformin 1000 mg twice daily Increase Tresiba to 28 units daily Start Trulicity 0.75 mg once weekly Start correction factor: NovoLog (BG -130/30)  EDUCATION / INSTRUCTIONS: BG monitoring instructions: Patient is instructed to check her blood sugars 3 times a day, before meals. Call [facility] if: BG persistently < 70  I reviewed the Rule of 15 for the treatment of hypoglycemia in detail with the patient. Literature supplied.   2) Diabetic complications:  Eye: Does not have known diabetic retinopathy.  Neuro/ Feet: Does have known diabetic peripheral neuropathy. Renal: Patient does not have known baseline CKD. She is not on an ACEI/ARB at present.      Signed electronically by: Lyndle Herrlich, MD  [facility] Endocrinology  [facility] [address] Laurell Josephs [address] [place], [place] [address] Phone:  [phone] FAX: [phone]   CC: Christel Mormon, MD [address] [place] [address] Phone: [phone]  Fax: [phone]    Return to Endocrinology clinic as below: No future appointments.

## 2022-07-13 ENCOUNTER — Other Ambulatory Visit (HOSPITAL_COMMUNITY): Payer: Self-pay

## 2022-08-27 ENCOUNTER — Emergency Department (HOSPITAL_COMMUNITY)
Admission: EM | Admit: 2022-08-27 | Discharge: 2022-08-28 | Disposition: A | Discharge status: Home / Self Care | Payer: Medicaid Other | Attending: Emergency Medicine | Admitting: Emergency Medicine

## 2022-08-27 ENCOUNTER — Encounter (HOSPITAL_COMMUNITY): Payer: Self-pay

## 2022-08-27 DIAGNOSIS — Z794 Long term (current) use of insulin: Secondary | ICD-10-CM | POA: Insufficient documentation

## 2022-08-27 DIAGNOSIS — L0231 Cutaneous abscess of buttock: Secondary | ICD-10-CM | POA: Diagnosis present

## 2022-08-27 LAB — CBC WITH DIFFERENTIAL/PLATELET
Abs Immature Granulocytes: 0.04 10*3/uL (ref 0.00–0.07)
Basophils Absolute: 0 10*3/uL (ref 0.0–0.1)
Basophils Relative: 0 %
Eosinophils Absolute: 0 10*3/uL (ref 0.0–0.5)
Eosinophils Relative: 0 %
HCT: 38.2 % (ref 36.0–46.0)
Hemoglobin: 12.5 g/dL (ref 12.0–15.0)
Immature Granulocytes: 0 %
Lymphocytes Relative: 25 %
Lymphs Abs: 3.6 10*3/uL (ref 0.7–4.0)
MCH: 27.7 pg (ref 26.0–34.0)
MCHC: 32.7 g/dL (ref 30.0–36.0)
MCV: 84.7 fL (ref 80.0–100.0)
Monocytes Absolute: 1.3 10*3/uL — ABNORMAL HIGH (ref 0.1–1.0)
Monocytes Relative: 9 %
Neutro Abs: 9.5 10*3/uL — ABNORMAL HIGH (ref 1.7–7.7)
Neutrophils Relative %: 66 %
Platelets: 286 10*3/uL (ref 150–400)
RBC: 4.51 MIL/uL (ref 3.87–5.11)
RDW: 12.3 % (ref 11.5–15.5)
WBC: 14.5 10*3/uL — ABNORMAL HIGH (ref 4.0–10.5)
nRBC: 0 % (ref 0.0–0.2)

## 2022-08-27 IMAGING — CT CT PELVIS W/ CM
2 of 3 series · 16 of 46 positions shown, 18 images · IV contrast (APPLIED)
Comparison: CT pelvis dated 04/04/2018.

CLINICAL DATA: Perianal abscess.

EXAM:
CT PELVIS WITH CONTRAST
TECHNIQUE: Multidetector CT imaging of the pelvis was performed using the
standard protocol following the bolus administration of intravenous
contrast.
CONTRAST:  75mL OMNIPAQUE IOHEXOL 350 MG/ML SOLN

[Series 5: soft tissue · axial · 0.84mm/px · z∈[+606,+884]mm · 13 of 161 slices shown, 15 images]
[im 11/161  soft-tissue]
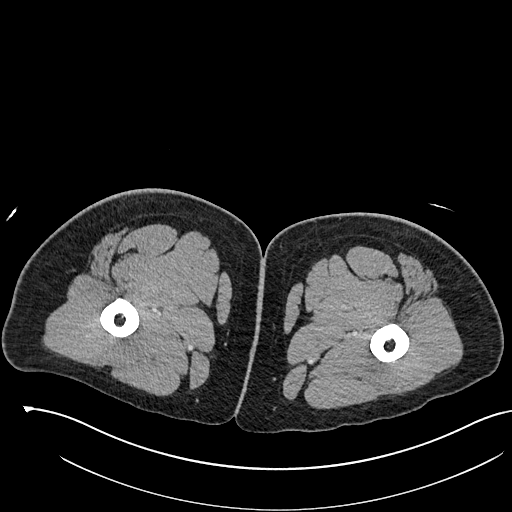
[im 11/161  bone]
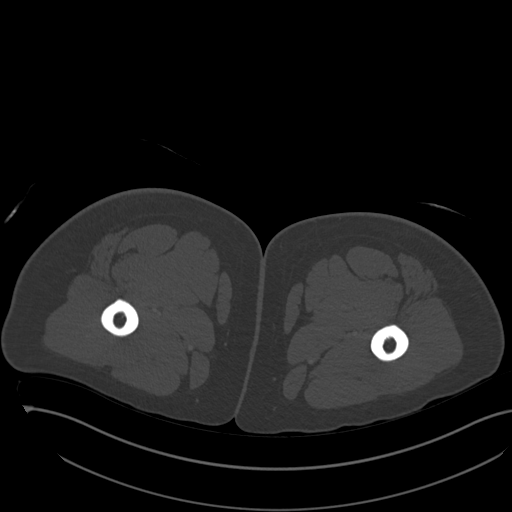
[im 21/161  soft-tissue]
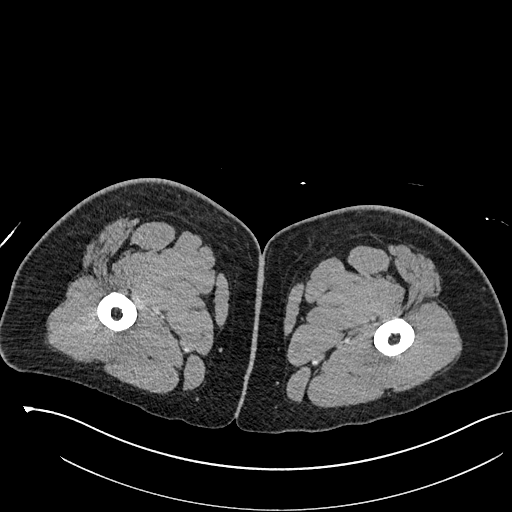
[im 31/161  soft-tissue]
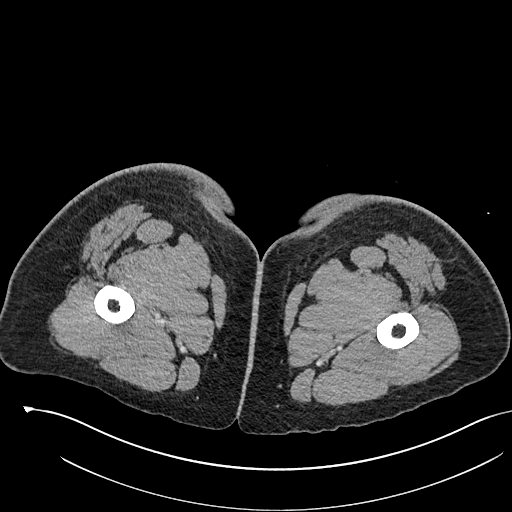
[im 47/161  soft-tissue]
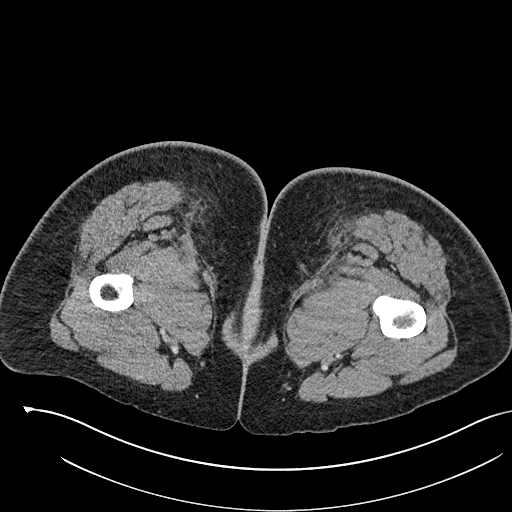
[im 57/161  soft-tissue]
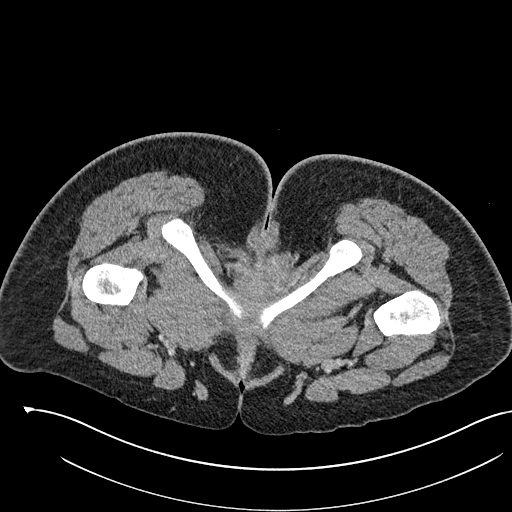
[im 68/161  soft-tissue]
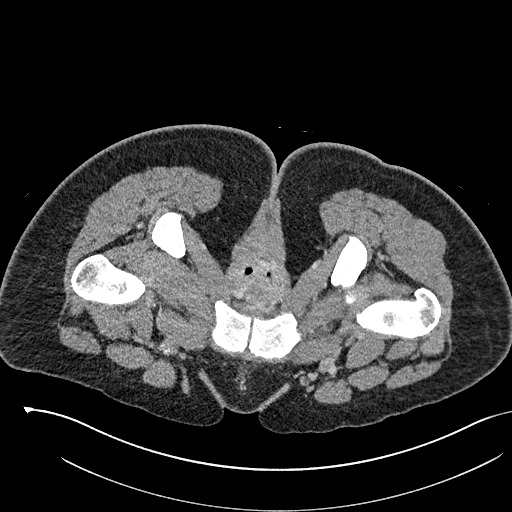
[im 83/161  soft-tissue]
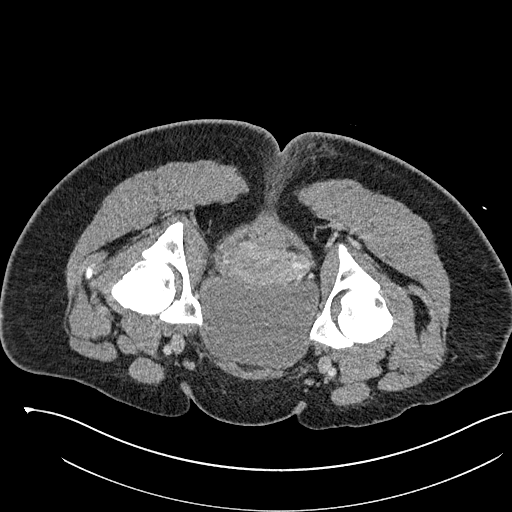
[im 93/161  soft-tissue]
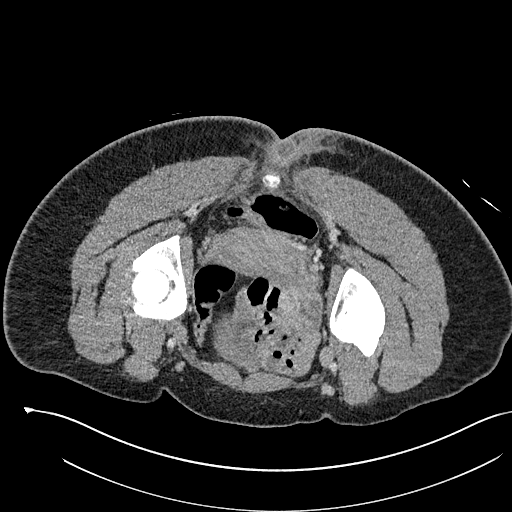
[im 104/161  soft-tissue]
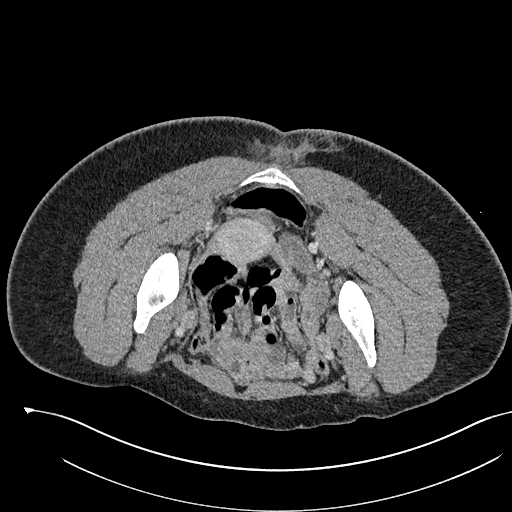
[im 104/161  bone]
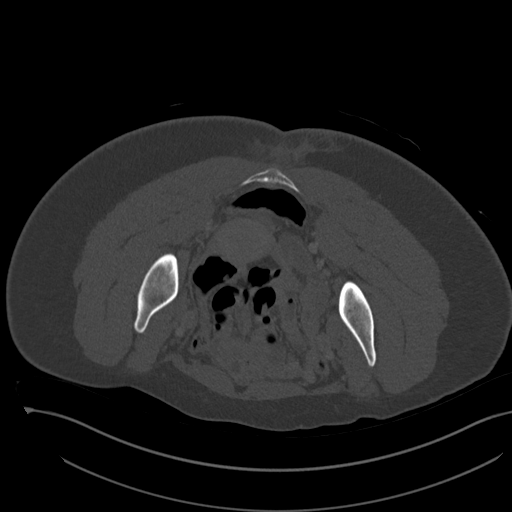
[im 114/161  soft-tissue]
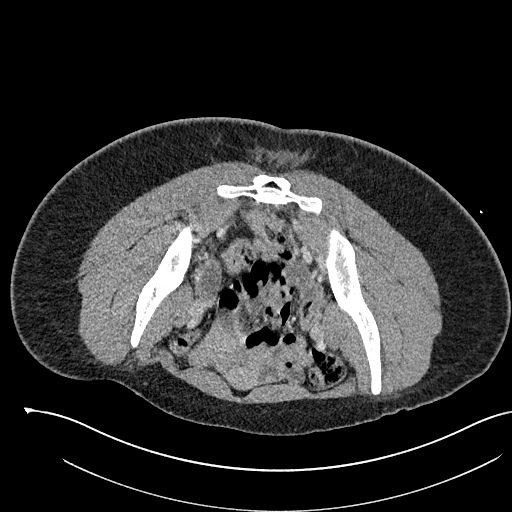
[im 130/161  soft-tissue]
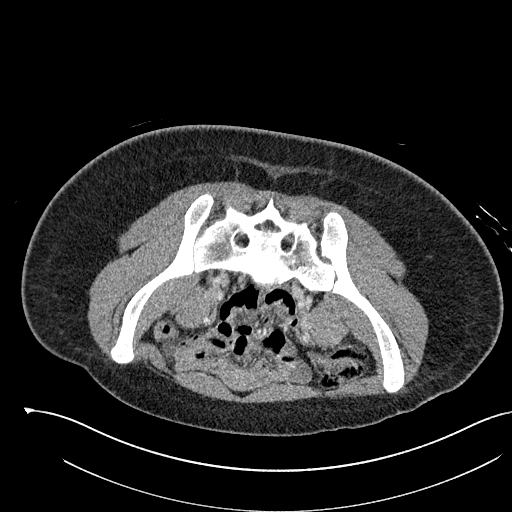
[im 140/161  soft-tissue]
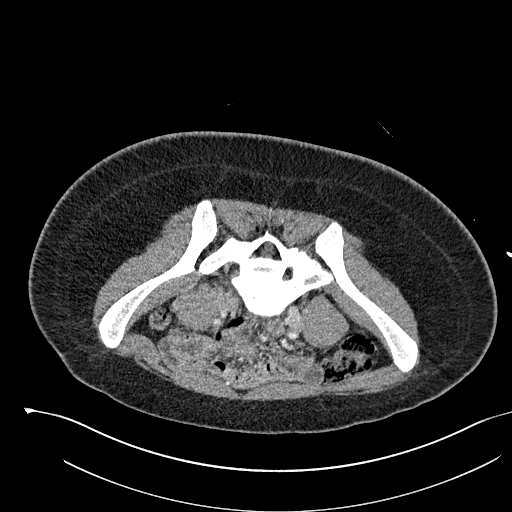
[im 150/161  soft-tissue]
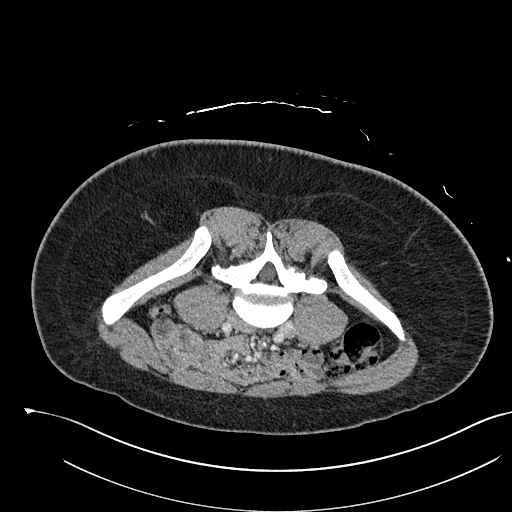

[Series 6: cor soft · coronal · 0.64mm/px · 3 of 134 slices shown]
[im 45/134  soft-tissue]
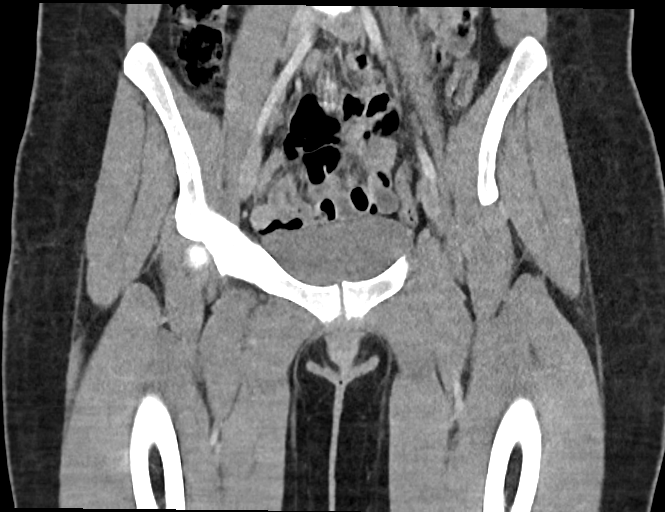
[im 60/134  soft-tissue]
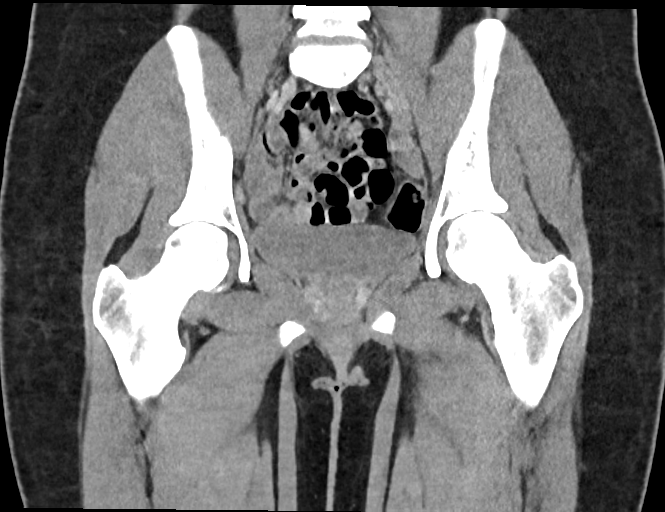
[im 74/134  soft-tissue]
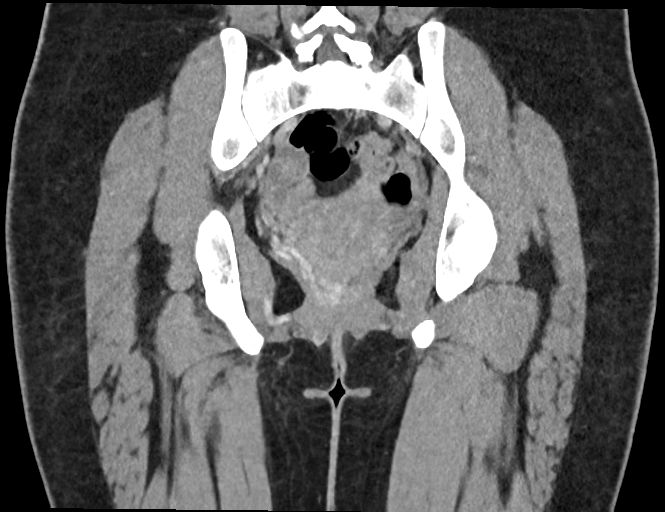

[16 of 46 positions shown; findings below may reference images not displayed]

FINDINGS: Urinary Tract:  No abnormality visualized.

Bowel:  Unremarkable visualized pelvic bowel loops.

Vascular/Lymphatic: No pathologically enlarged lymph nodes. No
significant vascular abnormality seen.

Reproductive:  No mass or other significant abnormality

Other: A rim enhancing abscess with surrounding inflammatory changes
overlies the coccyx near the midline at the superior aspect of the
gluteal cleft. This measures 1.8 (series 5, image 69) x 2.1 x 1.8 cm
(series 6, image 123). Inflammatory changes extend inferiorly and
superiorly. There is no definite fistulous track connecting this
abscess with the anus or rectum. There is no definite extension of
inflammatory changes into the pelvis. There is no focal osseous
demineralization of the underlying coccyx to suggest osteomyelitis.

Musculoskeletal: No suspicious bone lesions identified.
IMPRESSION: Rim enhancing abscess near the midline at the superior aspect of the
gluteal cleft overlying the coccyx. No definite fistulous connection
between this abscess in the anus or rectum. No extension of
inflammatory changes into the pelvis.

## 2022-08-27 MED ORDER — HYDROCODONE-ACETAMINOPHEN 5-325 MG PO TABS
1.0000 | ORAL_TABLET | Freq: Once | ORAL | Status: AC
  Administered 2022-08-27: 1 via ORAL
  Filled 2022-08-27: qty 1

## 2022-08-27 MED ORDER — LIDOCAINE-EPINEPHRINE 1 %-1:100000 IJ SOLN
20.0000 mL | Freq: Once | INTRAMUSCULAR | Status: AC
  Administered 2022-08-27: 20 mL via INTRADERMAL
  Filled 2022-08-27: qty 1

## 2022-08-27 MED ORDER — ONDANSETRON 4 MG PO TBDP
4.0000 mg | ORAL_TABLET | Freq: Once | ORAL | Status: AC
  Administered 2022-08-27: 4 mg via ORAL
  Filled 2022-08-27: qty 1

## 2022-08-27 NOTE — ED Provider Notes (Signed)
[facility] Provider Note   CSN: [id number] Arrival date & time: 08/27/22  1614     History  Chief Complaint  Patient presents with   Abscess    Danielle Norman is a 22 y.o. female with PMH of recurrent pilonidal cyst.  Presenting with abscess on her right buttocks thumb initially began 2 days ago & is now very sore & too tender to sit upright.  Abscess is not draining at this time.  Patient has had nausea, no vomiting.  She has had low-grade fevers at home.  She is also endorsing some lower back pain around where the abscesses. Denies change in stool, dysuria, hematochezia, hematuria or dysuria  Abscess      Home Medications Prior to Admission medications   Medication Sig Start Date End Date Taking? Authorizing Provider  Accu-Chek FastClix Lancets MISC Check sugar 6 x daily Patient not taking: Reported on 07/09/2022 10/21/18   Hermenia Bers, NP  acetone, urine, test strip Check ketones per protocol Patient not taking: Reported on 07/09/2022 11/01/14   Janell Quiet, MD  blood glucose meter kit and supplies Dispense based on patient and insurance preference. Use up to four times daily as directed. (FOR ICD-10 E10.9, E11.9). Patient not taking: Reported on 07/09/2022 10/20/21   Tedd Sias, PA  Blood Glucose Monitoring Suppl (ACCU-CHEK GUIDE ME) w/Device KIT 1 Device by Does not apply route 3 (three) times daily. 07/09/22   Shamleffer, Melanie Crazier, MD  docusate sodium (COLACE) 100 MG capsule Take 1 capsule (100 mg total) by mouth 2 (two) times daily. Patient not taking: Reported on 07/09/2022 12/27/21   Dessa Phi, DO  Dulaglutide (TRULICITY) [id number] SOPN Inject 0.75 mg into the skin once a week. 07/09/22   Shamleffer, Melanie Crazier, MD  emtricitabine-tenofovir (TRUVADA) 200-300 MG tablet Take 1 tablet by mouth daily. 12/25/21   Dessa Phi, DO  Etonogestrel [facility]) Inject into the skin.    [provider]  glucagon 1 MG injection Inject 1 mg into the muscle once as needed for up to 1 dose. Use for Severe Hypoglycemia . Inject 1 mg intramuscularly if unresponsive, unable to swallow, unconscious and/or has seizure 07/09/22   Shamleffer, Melanie Crazier, MD  glucose blood (ACCU-CHEK GUIDE) test strip 1 each by Other route 3 (three) times daily. Use as instructed 07/09/22   Shamleffer, Melanie Crazier, MD  insulin aspart (NOVOLOG FLEXPEN) 100 UNIT/ML FlexPen Max daily 30 units 07/09/22   Shamleffer, Melanie Crazier, MD  insulin glargine (LANTUS SOLOSTAR) 100 UNIT/ML Solostar Pen Inject 28 Units into the skin daily. 07/13/22   Shamleffer, Melanie Crazier, MD  Insulin Pen Needle (BD PEN NEEDLE NANO U/F) 32G X 4 MM MISC BD PEN NEEDLES- BRAND SPECIFIC. INJECT INSULIN VIA INSULIN PEN 6 X DAILY Patient not taking: Reported on 07/09/2022 10/20/21   Tedd Sias, PA  Insulin Pen Needle (PEN NEEDLES 3/16") 31G X 5 MM MISC 1 each by Does not apply route in the morning, at noon, in the evening, and at bedtime. 07/09/22   Shamleffer, Melanie Crazier, MD  metFORMIN (GLUCOPHAGE) 1000 MG tablet Take 1 tablet (1,000 mg total) by mouth 2 (two) times daily. 07/09/22   Shamleffer, Melanie Crazier, MD  naproxen sodium (ALEVE) 220 MG tablet Take 440 mg by mouth 2 (two) times daily as needed (pain). Patient not taking: Reported on 07/09/2022    [provider]      Allergies    Patient has  no known allergies.    Review of Systems   See HPI  Physical Exam Updated Vital Signs BP 136/61 (BP Location: Right Arm)   Pulse (!) 112   Temp 99.8 F (37.7 C)   Resp 16   SpO2 94%  Physical Exam Vitals and nursing note reviewed.  Constitutional:      General: She is not in acute distress.    Appearance: She is well-developed.     Comments: Extremely uncomfortable  HENT:     Head: Normocephalic and atraumatic.     Nose: Nose normal.     Mouth/Throat:     Mouth: Mucous membranes are moist.     Pharynx:  Oropharynx is clear.  Eyes:     Conjunctiva/sclera: Conjunctivae normal.  Cardiovascular:     Rate and Rhythm: Normal rate and regular rhythm.     Heart sounds: No murmur heard. Pulmonary:     Effort: Pulmonary effort is normal. No respiratory distress.     Breath sounds: Normal breath sounds.  Abdominal:     General: There is no distension.     Palpations: Abdomen is soft.     Tenderness: There is no abdominal tenderness.  Musculoskeletal:        General: No swelling.     Cervical back: Neck supple.  Skin:    General: Skin is warm and dry.     Capillary Refill: Capillary refill takes less than 2 seconds.     Comments: Large approx 5 cm x 4 cm indurated lesion on the superior right buttock, does not involve interior gluteal tissue or anal sphincter. Extremely ttp and tenderness extends around the indurated area   Neurological:     Mental Status: She is alert.  Psychiatric:        Mood and Affect: Mood normal.     ED Results / Procedures / Treatments   Labs (all labs ordered are listed, but only abnormal results are displayed) Labs Reviewed  CBC WITH DIFFERENTIAL/PLATELET - Abnormal; Notable for the following components:      Result Value   WBC 14.5 (*)    Neutro Abs 9.5 (*)    Monocytes Absolute 1.3 (*)    All other components within normal limits  COMPREHENSIVE METABOLIC PANEL  HCG, QUANTITATIVE, PREGNANCY    EKG None  Radiology No results found.  Procedures Procedures    Medications Ordered in ED Medications  lidocaine-EPINEPHrine (XYLOCAINE W/EPI) 1 %-1:100000 (with pres) injection 20 mL (20 mLs Intradermal Given 08/27/22 2150)  HYDROcodone-acetaminophen (NORCO/VICODIN) 5-325 MG per tablet 1 tablet (1 tablet Oral Given 08/27/22 2150)  ondansetron (ZOFRAN-ODT) disintegrating tablet 4 mg (4 mg Oral Given 08/27/22 2151)    ED Course/ Medical Decision Making/ A&P   {  Medical Decision Making Amount and/or Complexity of Data Reviewed Labs:  ordered. Radiology: ordered.  Risk Prescription drug management.   On arrival patient is tachycardic and extremely uncomfortable.  Initially tried to ultrasound the area at bedside, unable to identify well-described fluid pocket.  However, large indurated area palpated on exam.  Attempted to drain the area using syringe, but no purulent fluid was able to be removed.  Obtain CT scan for further imaging.  Labs obtained to rule out further infection. Pending at time of transfer of care.        Final Clinical Impression(s) / ED Diagnoses Final diagnoses:  Abscess of buttock, right     Bradd Canary, MD 08/28/22 Ninetta Lights    Noemi Chapel, MD 08/28/22 1453

## 2022-08-27 NOTE — ED Provider Triage Note (Signed)
Emergency Medicine Provider Triage Evaluation Note  Danielle Norman , a 22 y.o. female  was evaluated in triage.  Patient complaining of abscess to the right buttocks.  Has been going on for 2 days.  Says that she has had 1 in this area previously that required I&D.  Review of Systems  Positive:  Negative:   Physical Exam  BP 136/61 (BP Location: Right Arm)   Pulse (!) 112   Temp 99.8 F (37.7 C)   Resp 16   SpO2 94%  Gen:   Awake, no distress   Resp:  Normal effort  MSK:   Moves extremities without difficulty  Other:  Performed in presence of RN.  Some induration but some medial fluctuance.  Does not appear to involve the rectum.  Medical Decision Making  Medically screening exam initiated at 4:29 PM.  Appropriate orders placed.  MEELAH STEGALL was informed that the remainder of the evaluation will be completed by another provider, this initial triage assessment does not replace that evaluation, and the importance of remaining in the ED until their evaluation is complete.    Rhae Hammock, PA-C 08/27/22 1630

## 2022-08-27 NOTE — ED Triage Notes (Signed)
Pt came in via POV d/t an abscess on her buttocks, this happened 2 times before & this time it started 2 days ago & is now very sore & too tender to sit upright. A/Ox4, has a low grade fever, is not draining at this time.

## 2022-08-28 ENCOUNTER — Emergency Department (HOSPITAL_COMMUNITY): Payer: Medicaid Other

## 2022-08-28 LAB — HCG, QUANTITATIVE, PREGNANCY: hCG, Beta Chain, Quant, S: 1 m[IU]/mL (ref <5)

## 2022-08-28 LAB — COMPREHENSIVE METABOLIC PANEL
ALT: 15 U/L (ref 0–44)
AST: 14 U/L — ABNORMAL LOW (ref 15–41)
Albumin: 3.6 g/dL (ref 3.5–5.0)
Alkaline Phosphatase: 95 U/L (ref 38–126)
Anion gap: 13 (ref 5–15)
BUN: 10 mg/dL (ref 6–20)
CO2: 24 mmol/L (ref 22–32)
Calcium: 9.3 mg/dL (ref 8.9–10.3)
Chloride: 97 mmol/L — ABNORMAL LOW (ref 98–111)
Creatinine, Ser: 0.64 mg/dL (ref 0.44–1.00)
GFR, Estimated: >60 mL/min (ref >60)
Glucose, Bld: 310 mg/dL — ABNORMAL HIGH (ref 70–99)
Potassium: 3.3 mmol/L — ABNORMAL LOW (ref 3.5–5.1)
Sodium: 134 mmol/L — ABNORMAL LOW (ref 135–145)
Total Bilirubin: 0.8 mg/dL (ref 0.3–1.2)
Total Protein: 7.7 g/dL (ref 6.5–8.1)

## 2022-08-28 MED ORDER — IOHEXOL 350 MG/ML SOLN
75.0000 mL | Freq: Once | INTRAVENOUS | Status: AC | PRN
  Administered 2022-08-28: 75 mL via INTRAVENOUS

## 2022-08-28 MED ORDER — HYDROMORPHONE HCL 1 MG/ML IJ SOLN
1.0000 mg | Freq: Once | INTRAMUSCULAR | Status: AC
  Administered 2022-08-28: 1 mg via INTRAVENOUS
  Filled 2022-08-28: qty 1

## 2022-08-28 MED ORDER — SULFAMETHOXAZOLE-TRIMETHOPRIM 800-160 MG PO TABS: 1.0000 | ORAL_TABLET | Freq: Two times a day (BID) | ORAL | 0 refills | Status: AC

## 2022-08-28 MED ORDER — OXYCODONE-ACETAMINOPHEN 5-325 MG PO TABS
1.0000 | ORAL_TABLET | Freq: Once | ORAL | Status: AC
  Administered 2022-08-28: 1 via ORAL
  Filled 2022-08-28: qty 1

## 2022-08-28 MED ORDER — SENNOSIDES-DOCUSATE SODIUM 8.6-50 MG PO TABS: 1.0000 | ORAL_TABLET | Freq: Every evening | ORAL | 0 refills | Status: AC | PRN

## 2022-08-28 MED ORDER — SULFAMETHOXAZOLE-TRIMETHOPRIM 800-160 MG PO TABS
1.0000 | ORAL_TABLET | Freq: Once | ORAL | Status: AC
  Administered 2022-08-28: 1 via ORAL
  Filled 2022-08-28: qty 1

## 2022-08-28 MED ORDER — OXYCODONE-ACETAMINOPHEN 5-325 MG PO TABS: 1.0000 | ORAL_TABLET | Freq: Four times a day (QID) | ORAL | 0 refills | Status: DC | PRN

## 2022-08-28 NOTE — ED Provider Notes (Signed)
Blood pressure 132/62, pulse 99, temperature 99.8 F (37.7 C), resp. rate 18, SpO2 96 %.  Assuming care from Dr. Sabra Heck.  In short, Danielle Norman is a 22 y.o. female with a chief complaint of Abscess .  Refer to the original H&P for additional details.  The current plan of care is to follow up on labs and CT.  02:30 AM CBC with leukocytosis to 14.5.  No acute kidney injury.  LFTs and bilirubin normal. No SIRS vitals. CT shows gluteal cleft abscess which I am able to visualize at the bedside on [place].  I spoke with the patient about these findings.  She underwent attempted needle aspiration earlier in the shift with a different provider.  I discussed that given the findings on CT and my bedside exam I would like to repeat the needle aspiration and if successful perform incision/drainage and packing.  She provides verbal consent for me to move forward with this procedure.  Nurse chaperone at bedside.  Procedure documented as below.  Patient tolerated the procedure well.  Will discharge home with Bactrim, Percocet, 2-day wound follow-up, advise close general surgery follow up as well. Parents will be in to the ED to drive her home.   Marland Kitchen.Incision and Drainage  Date/Time: 08/28/2022 2:34 AM  Performed by: Margette Fast, MD Authorized by: Margette Fast, MD   Consent:    Consent obtained:  Verbal   Consent given by:  Patient   Risks, benefits, and alternatives were discussed: yes     Risks discussed:  Bleeding, damage to other organs, infection, incomplete drainage and pain   Alternatives discussed:  No treatment Universal protocol:    Patient identity confirmed:  Verbally with patient Location:    Type:  Abscess   Size:  3 x 4 cm   Location:  Anogenital   Anogenital location:  Gluteal cleft Pre-procedure details:    Skin preparation:  Povidone-iodine Sedation:    Sedation type:  None Anesthesia:    Anesthesia method:  Local infiltration   Local anesthetic:  Lidocaine 2% WITH  epi Procedure type:    Complexity:  Complex Procedure details:    Ultrasound guidance: yes     Needle aspiration: yes (15 ml of purulent fluid aspirated)     Needle size:  18 G   Incision depth:  Subcutaneous   Wound management:  Probed and deloculated   Drainage:  Purulent   Drainage amount:  Moderate   Wound treatment:  Wound left open   Packing materials:  1/4 in iodoform gauze   Amount 1/4" iodoform:  4 cm Post-procedure details:    Procedure completion:  Tolerated well, no immediate complications      Danielle Norman, Danielle Olds, MD 08/28/22 [phone]

## 2022-08-28 NOTE — Discharge Instructions (Signed)
You were seen in the emergency room today with an abscess to your buttock.  We were able to remove most of the fluid in the abscess but you still have some surrounding swelling from the infection.  I would expect this to go down over the next several days with antibiotics.  I have put some packing in the wound and would like for you to either be seen by surgery in the next 2 days or return to the ER to have this area reevaluated.   If you begin having worsening pain, fever, other sudden/severe symptoms you should return to the emergency department sooner.   Please take your antibiotics as prescribed and complete the course.

## 2022-09-20 ENCOUNTER — Emergency Department (HOSPITAL_COMMUNITY)
Admission: EM | Admit: 2022-09-20 | Discharge: 2022-09-20 | Disposition: A | Discharge status: Home / Self Care | Payer: Medicaid Other | Attending: Emergency Medicine | Admitting: Emergency Medicine

## 2022-09-20 ENCOUNTER — Other Ambulatory Visit: Payer: Self-pay

## 2022-09-20 ENCOUNTER — Encounter (HOSPITAL_COMMUNITY): Payer: Self-pay

## 2022-09-20 DIAGNOSIS — R682 Dry mouth, unspecified: Secondary | ICD-10-CM | POA: Insufficient documentation

## 2022-09-20 DIAGNOSIS — R112 Nausea with vomiting, unspecified: Secondary | ICD-10-CM | POA: Diagnosis present

## 2022-09-20 DIAGNOSIS — Z7984 Long term (current) use of oral hypoglycemic drugs: Secondary | ICD-10-CM | POA: Insufficient documentation

## 2022-09-20 DIAGNOSIS — R1030 Lower abdominal pain, unspecified: Secondary | ICD-10-CM | POA: Insufficient documentation

## 2022-09-20 DIAGNOSIS — Z794 Long term (current) use of insulin: Secondary | ICD-10-CM | POA: Diagnosis not present

## 2022-09-20 DIAGNOSIS — R197 Diarrhea, unspecified: Secondary | ICD-10-CM | POA: Diagnosis not present

## 2022-09-20 DIAGNOSIS — E119 Type 2 diabetes mellitus without complications: Secondary | ICD-10-CM | POA: Diagnosis not present

## 2022-09-20 LAB — URINALYSIS, ROUTINE W REFLEX MICROSCOPIC
Bilirubin Urine: NEGATIVE
Glucose, UA: >=500 mg/dL — AB
Hgb urine dipstick: NEGATIVE
Ketones, ur: 80 mg/dL — AB
Leukocytes,Ua: NEGATIVE
Nitrite: NEGATIVE
Protein, ur: NEGATIVE mg/dL
Specific Gravity, Urine: 1.038 — ABNORMAL HIGH (ref 1.005–1.030)
pH: 6 (ref 5.0–8.0)

## 2022-09-20 LAB — CBC WITH DIFFERENTIAL/PLATELET
Abs Immature Granulocytes: 0.02 10*3/uL (ref 0.00–0.07)
Basophils Absolute: 0 10*3/uL (ref 0.0–0.1)
Basophils Relative: 0 %
Eosinophils Absolute: 0 10*3/uL (ref 0.0–0.5)
Eosinophils Relative: 0 %
HCT: 43.1 % (ref 36.0–46.0)
Hemoglobin: 14.2 g/dL (ref 12.0–15.0)
Immature Granulocytes: 0 %
Lymphocytes Relative: 4 %
Lymphs Abs: 0.4 10*3/uL — ABNORMAL LOW (ref 0.7–4.0)
MCH: 27.5 pg (ref 26.0–34.0)
MCHC: 32.9 g/dL (ref 30.0–36.0)
MCV: 83.4 fL (ref 80.0–100.0)
Monocytes Absolute: 0.3 10*3/uL (ref 0.1–1.0)
Monocytes Relative: 3 %
Neutro Abs: 9.7 10*3/uL — ABNORMAL HIGH (ref 1.7–7.7)
Neutrophils Relative %: 93 %
Platelets: 275 10*3/uL (ref 150–400)
RBC: 5.17 MIL/uL — ABNORMAL HIGH (ref 3.87–5.11)
RDW: 12.4 % (ref 11.5–15.5)
WBC: 10.5 10*3/uL (ref 4.0–10.5)
nRBC: 0 % (ref 0.0–0.2)

## 2022-09-20 LAB — I-STAT VENOUS BLOOD GAS, ED
Acid-Base Excess: 0 mmol/L (ref 0.0–2.0)
Bicarbonate: 24.9 mmol/L (ref 20.0–28.0)
Calcium, Ion: 1.16 mmol/L (ref 1.15–1.40)
HCT: 45 % (ref 36.0–46.0)
Hemoglobin: 15.3 g/dL — ABNORMAL HIGH (ref 12.0–15.0)
O2 Saturation: 86 %
Potassium: 3.7 mmol/L (ref 3.5–5.1)
Sodium: 137 mmol/L (ref 135–145)
TCO2: 26 mmol/L (ref 22–32)
pCO2, Ven: 40 mmHg — ABNORMAL LOW (ref 44–60)
pH, Ven: 7.403 (ref 7.25–7.43)
pO2, Ven: 52 mmHg — ABNORMAL HIGH (ref 32–45)

## 2022-09-20 LAB — COMPREHENSIVE METABOLIC PANEL
ALT: 18 U/L (ref 0–44)
AST: 16 U/L (ref 15–41)
Albumin: 3.8 g/dL (ref 3.5–5.0)
Alkaline Phosphatase: 95 U/L (ref 38–126)
Anion gap: 15 (ref 5–15)
BUN: 14 mg/dL (ref 6–20)
CO2: 24 mmol/L (ref 22–32)
Calcium: 9.4 mg/dL (ref 8.9–10.3)
Chloride: 96 mmol/L — ABNORMAL LOW (ref 98–111)
Creatinine, Ser: 0.62 mg/dL (ref 0.44–1.00)
GFR, Estimated: >60 mL/min (ref >60)
Glucose, Bld: 341 mg/dL — ABNORMAL HIGH (ref 70–99)
Potassium: 3.6 mmol/L (ref 3.5–5.1)
Sodium: 135 mmol/L (ref 135–145)
Total Bilirubin: 1.2 mg/dL (ref 0.3–1.2)
Total Protein: 7.9 g/dL (ref 6.5–8.1)

## 2022-09-20 LAB — CBG MONITORING, ED: Glucose-Capillary: 329 mg/dL — ABNORMAL HIGH (ref 70–99)

## 2022-09-20 LAB — BETA-HYDROXYBUTYRIC ACID: Beta-Hydroxybutyric Acid: 0.72 mmol/L — ABNORMAL HIGH (ref 0.05–0.27)

## 2022-09-20 LAB — I-STAT BETA HCG BLOOD, ED (MC, WL, AP ONLY): I-stat hCG, quantitative: <5 m[IU]/mL (ref <5)

## 2022-09-20 LAB — LIPASE, BLOOD: Lipase: 19 U/L (ref 11–51)

## 2022-09-20 MED ORDER — DICYCLOMINE HCL 20 MG PO TABS: 20.0000 mg | ORAL_TABLET | Freq: Two times a day (BID) | ORAL | 0 refills | Status: AC

## 2022-09-20 MED ORDER — ONDANSETRON HCL 4 MG/2ML IJ SOLN
4.0000 mg | Freq: Once | INTRAMUSCULAR | Status: AC
  Administered 2022-09-20: 4 mg via INTRAVENOUS
  Filled 2022-09-20: qty 2

## 2022-09-20 MED ORDER — DICYCLOMINE HCL 10 MG PO CAPS
10.0000 mg | ORAL_CAPSULE | Freq: Once | ORAL | Status: AC
  Administered 2022-09-20: 10 mg via ORAL
  Filled 2022-09-20: qty 1

## 2022-09-20 MED ORDER — ONDANSETRON HCL 4 MG PO TABS: 4.0000 mg | ORAL_TABLET | Freq: Four times a day (QID) | ORAL | 0 refills | Status: AC

## 2022-09-20 MED ORDER — LACTATED RINGERS IV BOLUS
1000.0000 mL | Freq: Once | INTRAVENOUS | Status: AC
  Administered 2022-09-20: 1000 mL via INTRAVENOUS

## 2022-09-20 NOTE — Discharge Instructions (Addendum)
You were seen in the ER today for evaluation of your nausea, vomiting, diarrhea. Your sugar was a little elevated. Please make sure you are monitoring it appropriately. I am going to send you home with a few of the Zofran medication as well as the Bentyl.  I am glad you are feeling better.  I recommend a bland diet for the next few days to help your stomach recover.  Please make sure you are drinking plenty of fluids, mainly water.  If you have any concerns, new or worsening symptoms, please return to the nearest emergency department for evaluation.  Contact a doctor if: Your symptoms get worse. You have new symptoms. You have a fever. You cannot drink fluids without vomiting. You feel like you may vomit for more than 2 days. You feel light-headed or dizzy. You have a headache. You have muscle cramps. You have a rash. You have pain while peeing. Get help right away if: You have pain in your chest, neck, arm, or jaw. You feel very weak or you faint. You vomit again and again. You have vomit that is bright red or looks like black coffee grounds. You have bloody or black poop (stools) or poop that looks like tar. You have a very bad headache, a stiff neck, or both. You have very bad pain, cramping, or bloating in your belly (abdomen). You have trouble breathing. You are breathing very quickly. Your heart is beating very quickly. Your skin feels cold and clammy. You feel confused. You have signs of losing too much water in your body, such as: Dark pee, very little pee, or no pee. Cracked lips. Dry mouth. Sunken eyes. Sleepiness. Weakness. These symptoms may be an emergency. Get help right away. Call 911. Do not wait to see if the symptoms will go away. Do not drive yourself to the hospital.

## 2022-09-20 NOTE — ED Triage Notes (Addendum)
Pt arrived POV from home c/o possible food poisoning. Pt states she has been vomiting since last night with diarrhea and endorses generalized abdominal pain

## 2022-09-20 NOTE — ED Provider Notes (Signed)
[place] [facility] [place] Provider Note   CSN: [id number] Arrival date & time: 09/20/22  1137     History Chief Complaint  Patient presents with   Abdominal Pain    Danielle Norman is a 22 y.o. female with history of type 2 diabetes presents the emergency room today for evaluation of nausea, vomiting, diarrhea, and lower abdominal pain since 2000 last night.  Patient reports that she ate some chicken wings and fries and around an hour later started to have nonblack, nonbloody, nonbilious emesis.  Reports she said around 12 episodes since then.  Reports some nausea.  Reports 2 episodes of watery, nonblack, nonbloody diarrhea.  She reports that she has some lower suprapubic pain that is intermittent and not constant.  Reports more of a cramping sensation.  She denies any fever, chest pain, shortness of breath, dysuria, hematuria, vaginal discharge, vaginal bleeding.  Does report some fatigue.  Other members who ate with her do not have any symptoms however they did eat different meals.  She is currently on insulin and metformin for diabetes.  No known drug allergies.  Smokes Black and milds.  Denies any EtOH use.  Weekly marijuana use.   Abdominal Pain Associated symptoms: diarrhea, nausea and vomiting   Associated symptoms: no chest pain, no chills, no constipation, no cough, no dysuria, no fever, no hematuria, no shortness of breath, no vaginal bleeding and no vaginal discharge        Home Medications Prior to Admission medications   Medication Sig Start Date End Date Taking? Authorizing Provider  Accu-Chek FastClix Lancets MISC Check sugar 6 x daily Patient not taking: Reported on 07/09/2022 10/21/18   Gretchen ShortBeasley, Spenser, NP  acetone, urine, test strip Check ketones per protocol Patient not taking: Reported on 07/09/2022 11/01/14   Mittie BodoBarnett, Elyse Paige, MD  blood glucose meter kit and supplies Dispense based on patient and insurance preference. Use up to four  times daily as directed. (FOR ICD-10 E10.9, E11.9). Patient not taking: Reported on 07/09/2022 10/20/21   Gailen ShelterFondaw, Wylder S, PA  Blood Glucose Monitoring Suppl (ACCU-CHEK GUIDE ME) w/Device KIT 1 Device by Does not apply route 3 (three) times daily. 07/09/22   Shamleffer, Konrad DoloresIbtehal Jaralla, MD  docusate sodium (COLACE) 100 MG capsule Take 1 capsule (100 mg total) by mouth 2 (two) times daily. Patient not taking: Reported on 07/09/2022 12/27/21   Noralee Stainhoi, Jennifer, DO  Dulaglutide (TRULICITY) 0.75 MG/0.5ML SOPN Inject 0.75 mg into the skin once a week. 07/09/22   Shamleffer, Konrad DoloresIbtehal Jaralla, MD  emtricitabine-tenofovir (TRUVADA) 200-300 MG tablet Take 1 tablet by mouth daily. 12/25/21   Noralee Stainhoi, Jennifer, DO  Etonogestrel [facility](NEXPLANON [place]) Inject into the skin.    [provider]  glucagon 1 MG injection Inject 1 mg into the muscle once as needed for up to 1 dose. Use for Severe Hypoglycemia . Inject 1 mg intramuscularly if unresponsive, unable to swallow, unconscious and/or has seizure 07/09/22   Shamleffer, Konrad DoloresIbtehal Jaralla, MD  glucose blood (ACCU-CHEK GUIDE) test strip 1 each by Other route 3 (three) times daily. Use as instructed 07/09/22   Shamleffer, Konrad DoloresIbtehal Jaralla, MD  insulin aspart (NOVOLOG FLEXPEN) 100 UNIT/ML FlexPen Max daily 30 units 07/09/22   Shamleffer, Konrad DoloresIbtehal Jaralla, MD  insulin glargine (LANTUS SOLOSTAR) 100 UNIT/ML Solostar Pen Inject 28 Units into the skin daily. 07/13/22   Shamleffer, Konrad DoloresIbtehal Jaralla, MD  Insulin Pen Needle (BD PEN NEEDLE NANO U/F) 32G X 4 MM MISC BD PEN NEEDLES- BRAND SPECIFIC.  INJECT INSULIN VIA INSULIN PEN 6 X DAILY Patient not taking: Reported on 07/09/2022 10/20/21   Gailen Shelter, PA  Insulin Pen Needle (PEN NEEDLES 3/16") 31G X 5 MM MISC 1 each by Does not apply route in the morning, at noon, in the evening, and at bedtime. 07/09/22   Shamleffer, Konrad Dolores, MD  metFORMIN (GLUCOPHAGE) 1000 MG tablet Take 1 tablet (1,000 mg total) by mouth 2 (two) times daily.  07/09/22   Shamleffer, Konrad Dolores, MD  naproxen sodium (ALEVE) 220 MG tablet Take 440 mg by mouth 2 (two) times daily as needed (pain). Patient not taking: Reported on 07/09/2022    [provider]  oxyCODONE-acetaminophen (PERCOCET/ROXICET) 5-325 MG tablet Take 1 tablet by mouth every 6 (six) hours as needed for severe pain. 08/28/22   Long, Arlyss Repress, MD  senna-docusate (SENOKOT-S) 8.6-50 MG tablet Take 1 tablet by mouth at bedtime as needed for mild constipation. 08/28/22   Long, Arlyss Repress, MD      Allergies    Patient has no known allergies.    Review of Systems   Review of Systems  Constitutional:  Negative for chills and fever.  HENT:  Negative for congestion and rhinorrhea.   Respiratory:  Negative for cough and shortness of breath.   Cardiovascular:  Negative for chest pain.  Gastrointestinal:  Positive for abdominal pain, diarrhea, nausea and vomiting. Negative for blood in stool and constipation.  Genitourinary:  Negative for dysuria, hematuria, vaginal bleeding and vaginal discharge.    Physical Exam Updated Vital Signs BP 124/60   Pulse 90   Temp 98.8 F (37.1 C)   Resp 17   Ht 5' 11.5" (1.816 m)   Wt 96.2 kg   SpO2 100%   BMI 29.16 kg/m  Physical Exam Vitals and nursing note reviewed.  Constitutional:      Appearance: Normal appearance. She is not toxic-appearing.     Comments: Uncomfortable, but nontoxic-appearing  HENT:     Head: Normocephalic and atraumatic.     Mouth/Throat:     Mouth: Mucous membranes are dry.  Eyes:     General: No scleral icterus. Cardiovascular:     Rate and Rhythm: Normal rate and regular rhythm.     Comments: Heart rate 92 in the room Pulmonary:     Effort: Pulmonary effort is normal. No respiratory distress.     Breath sounds: Normal breath sounds.  Abdominal:     General: Bowel sounds are normal. There is no distension.     Palpations: Abdomen is soft.     Tenderness: There is no abdominal tenderness.     Comments:  No abdominal tenderness to palpation, specifically no suprapubic tenderness palpation.  Abdomen is soft without any guarding or rebound.  Normal active bowel sounds.  Musculoskeletal:        General: No deformity.     Cervical back: Normal range of motion.  Skin:    General: Skin is warm and dry.  Neurological:     General: No focal deficit present.     Mental Status: She is alert. Mental status is at baseline.     ED Results / Procedures / Treatments   Labs (all labs ordered are listed, but only abnormal results are displayed) Labs Reviewed  COMPREHENSIVE METABOLIC PANEL - Abnormal; Notable for the following components:      Result Value   Chloride 96 (*)    Glucose, Bld 341 (*)    All other components within normal limits  CBC WITH DIFFERENTIAL/PLATELET - Abnormal; Notable for the following components:   RBC 5.17 (*)    Neutro Abs 9.7 (*)    Lymphs Abs 0.4 (*)    All other components within normal limits  URINALYSIS, ROUTINE W REFLEX MICROSCOPIC - Abnormal; Notable for the following components:   Specific Gravity, Urine 1.038 (*)    Glucose, UA >=500 (*)    Ketones, ur 80 (*)    Bacteria, UA RARE (*)    All other components within normal limits  I-STAT VENOUS BLOOD GAS, ED - Abnormal; Notable for the following components:   pCO2, Ven 40.0 (*)    pO2, Ven 52 (*)    Hemoglobin 15.3 (*)    All other components within normal limits  LIPASE, BLOOD  BETA-HYDROXYBUTYRIC ACID  I-STAT BETA HCG BLOOD, ED ([facility], [facility], [facility] ONLY)  CBG MONITORING, ED    EKG None  Radiology No results found.  Procedures Procedures   Medications Ordered in ED Medications  dicyclomine (BENTYL) capsule 10 mg (has no administration in time range)  lactated ringers bolus 1,000 mL (1,000 mLs Intravenous New Bag/Given 09/20/22 1342)  ondansetron (ZOFRAN) injection 4 mg (4 mg Intravenous Given 09/20/22 1342)    ED Course/ Medical Decision Making/ A&P    Medical Decision Making Amount and/or  Complexity of Data Reviewed Labs: ordered.  Risk Prescription drug management.   22 y.o. female presents to the ER for evaluation of nausea, vomiting, diarrhea, and suprapubic abdominal pain. Differential diagnosis includes but is not limited to AAA, mesenteric ischemia, appendicitis, diverticulitis, DKA, gastroenteritis, nephrolithiasis, pancreatitis, constipation, UTI, bowel obstruction, biliary disease, IBD, PUD, hepatitis, ectopic pregnancy, ovarian torsion, PID. Vital signs some tachycardia that has now since improved. Physical exam as noted above.   Abdomen is nontender to palpation.  Soft.  No guarding or rebound noted.  We defer any imaging at this time, likely gastroenteritis.  Will order labs to rule out any electrolyte abnormality or dehydration as well as rule out DKA.  Fluids, Zofran, Bentyl ordered.  I independently reviewed and interpreted the patient's labs.  Urinalysis shows concentrated urine with greater than 500 glucose, 80 ketones, rare bacteria but there is no white blood cells or leukocytes or nitrites present.  CBC shows no leukocytosis however there is a mild left shift with neutrophil count at 9.7.  No anemia.  CMP shows mildly decreased chloride at 96, glucose is elevated at 341 in the known setting of type 2 diabetes.  Otherwise, no electrolyte or LFT abnormalities.  hCG is negative.  Lipase within normal limits.  I-STAT VBG shows pH within normal limits.  Beta hydroxybutyric acid is mildly elevated.   On re-evaluation, the patient reports she is feeling much better after the fluids and Zofran. She has already tolerated PO since being here and is requesting for two apple sauces. Tolerated this without emesis. The patient does have some ketones in her urine, likely from starvation ketosis. No anion gap and normal PH and bicarb. Low suspicion for DKA. The patient reports that her sugars are usually in the 200s so this is not extremely elevated from her baseline. Her abdomen is  non tender. Doubt any kidney stones or SBO. Likely gastroenteritis. Will send home on Zofran and bentyl. We discussed plan at bedside - medications, supportive care, and follow up with PCP. We discussed strict return precautions and red flag symptoms. The patient verbalized their understanding and agrees to the plan. The patient is stable and being discharged home in good condition.  Portions of this report may have been transcribed using voice recognition software. Every effort was made to ensure accuracy; however, inadvertent computerized transcription errors may be present.    Final Clinical Impression(s) / ED Diagnoses Final diagnoses:  Nausea, vomiting and diarrhea    Rx / DC Orders ED Discharge Orders          Ordered    ondansetron (ZOFRAN) 4 MG tablet  Every 6 hours        09/20/22 1541    dicyclomine (BENTYL) 20 MG tablet  2 times daily        09/20/22 1541              Achille Rich, PA-C 09/20/22 1618    Elayne Snare K, DO 09/21/22 [phone]

## 2022-12-22 ENCOUNTER — Encounter (HOSPITAL_COMMUNITY): Payer: Self-pay

## 2022-12-22 ENCOUNTER — Emergency Department (HOSPITAL_COMMUNITY)
Admission: EM | Admit: 2022-12-22 | Discharge: 2022-12-23 | Disposition: A | Discharge status: Home / Self Care | Payer: Medicaid Other | Attending: Emergency Medicine | Admitting: Emergency Medicine

## 2022-12-22 DIAGNOSIS — Z794 Long term (current) use of insulin: Secondary | ICD-10-CM | POA: Insufficient documentation

## 2022-12-22 DIAGNOSIS — M79602 Pain in left arm: Secondary | ICD-10-CM | POA: Diagnosis present

## 2022-12-22 DIAGNOSIS — L02214 Cutaneous abscess of groin: Secondary | ICD-10-CM | POA: Insufficient documentation

## 2022-12-22 DIAGNOSIS — R739 Hyperglycemia, unspecified: Secondary | ICD-10-CM | POA: Insufficient documentation

## 2022-12-22 DIAGNOSIS — Z7984 Long term (current) use of oral hypoglycemic drugs: Secondary | ICD-10-CM | POA: Diagnosis not present

## 2022-12-22 NOTE — ED Triage Notes (Signed)
She had birth control placed in her left upper arm and it is irritating her and she would like it removed, as well as having an abscess in her groin area that she is wanting to have drained due to it causing her pain and discomfort. The birth control device was placed 4 years ago, abscess is 65.64 days old.

## 2022-12-23 ENCOUNTER — Ambulatory Visit: Payer: Medicaid Other | Admitting: Internal Medicine

## 2022-12-23 LAB — CBC WITH DIFFERENTIAL/PLATELET
Abs Immature Granulocytes: 0.02 10*3/uL (ref 0.00–0.07)
Basophils Absolute: 0 10*3/uL (ref 0.0–0.1)
Basophils Relative: 0 %
Eosinophils Absolute: 0 10*3/uL (ref 0.0–0.5)
Eosinophils Relative: 0 %
HCT: 39.1 % (ref 36.0–46.0)
Hemoglobin: 13.1 g/dL (ref 12.0–15.0)
Immature Granulocytes: 0 %
Lymphocytes Relative: 37 %
Lymphs Abs: 3.4 10*3/uL (ref 0.7–4.0)
MCH: 27.5 pg (ref 26.0–34.0)
MCHC: 33.5 g/dL (ref 30.0–36.0)
MCV: 82.1 fL (ref 80.0–100.0)
Monocytes Absolute: 0.8 10*3/uL (ref 0.1–1.0)
Monocytes Relative: 8 %
Neutro Abs: 5.1 10*3/uL (ref 1.7–7.7)
Neutrophils Relative %: 55 %
Platelets: 301 10*3/uL (ref 150–400)
RBC: 4.76 MIL/uL (ref 3.87–5.11)
RDW: 12.7 % (ref 11.5–15.5)
WBC: 9.4 10*3/uL (ref 4.0–10.5)
nRBC: 0 % (ref 0.0–0.2)

## 2022-12-23 LAB — HCG, SERUM, QUALITATIVE: Preg, Serum: NEGATIVE

## 2022-12-23 LAB — BASIC METABOLIC PANEL
Anion gap: 15 (ref 5–15)
BUN: 8 mg/dL (ref 6–20)
CO2: 20 mmol/L — ABNORMAL LOW (ref 22–32)
Calcium: 9.5 mg/dL (ref 8.9–10.3)
Chloride: 100 mmol/L (ref 98–111)
Creatinine, Ser: 0.51 mg/dL (ref 0.44–1.00)
GFR, Estimated: >60 mL/min (ref >60)
Glucose, Bld: 320 mg/dL — ABNORMAL HIGH (ref 70–99)
Potassium: 3.6 mmol/L (ref 3.5–5.1)
Sodium: 135 mmol/L (ref 135–145)

## 2022-12-23 MED ORDER — LIDOCAINE-EPINEPHRINE (PF) 2 %-1:200000 IJ SOLN
20.0000 mL | Freq: Once | INTRAMUSCULAR | Status: AC
  Administered 2022-12-23: 20 mL
  Filled 2022-12-23: qty 20

## 2022-12-23 MED ORDER — FENTANYL CITRATE PF 50 MCG/ML IJ SOSY
50.0000 ug | PREFILLED_SYRINGE | Freq: Once | INTRAMUSCULAR | Status: AC
  Administered 2022-12-23: 50 ug via INTRAVENOUS
  Filled 2022-12-23: qty 1

## 2022-12-23 MED ORDER — DOXYCYCLINE HYCLATE 100 MG PO TABS
100.0000 mg | ORAL_TABLET | Freq: Once | ORAL | Status: AC
  Administered 2022-12-23: 100 mg via ORAL
  Filled 2022-12-23: qty 1

## 2022-12-23 MED ORDER — LACTATED RINGERS IV BOLUS
1000.0000 mL | Freq: Once | INTRAVENOUS | Status: AC
  Administered 2022-12-23: 1000 mL via INTRAVENOUS

## 2022-12-23 MED ORDER — OXYCODONE-ACETAMINOPHEN 5-325 MG PO TABS: 1.0000 | ORAL_TABLET | Freq: Four times a day (QID) | ORAL | 0 refills | Status: DC | PRN

## 2022-12-23 MED ORDER — ONDANSETRON HCL 4 MG/2ML IJ SOLN
4.0000 mg | Freq: Once | INTRAMUSCULAR | Status: AC
  Administered 2022-12-23: 4 mg via INTRAVENOUS
  Filled 2022-12-23: qty 2

## 2022-12-23 MED ORDER — LIDOCAINE-PRILOCAINE 2.5-2.5 % EX CREA
TOPICAL_CREAM | Freq: Once | CUTANEOUS | Status: AC
  Administered 2022-12-23: 1 via TOPICAL
  Filled 2022-12-23: qty 5

## 2022-12-23 MED ORDER — KETOROLAC TROMETHAMINE 15 MG/ML IJ SOLN
15.0000 mg | Freq: Once | INTRAMUSCULAR | Status: AC
  Administered 2022-12-23: 15 mg via INTRAVENOUS
  Filled 2022-12-23: qty 1

## 2022-12-23 MED ORDER — DOXYCYCLINE HYCLATE 100 MG PO CAPS: 100.0000 mg | ORAL_CAPSULE | Freq: Two times a day (BID) | ORAL | 0 refills | Status: DC

## 2022-12-23 NOTE — ED Provider Notes (Signed)
[place] EMERGENCY DEPARTMENT AT [facility] Provider Note   CSN: [id number] Arrival date & time: 12/22/22  1814     History  Chief Complaint  Patient presents with   Arm Pain   Abscess    Danielle Norman is a 22 y.o. female.  The history is provided by the patient and medical records.  Arm Pain  Abscess Danielle Norman is a 22 y.o. female who presents to the Emergency Department complaining of abscess.  She has a hx/o HS and noticed swelling to the right inguinal/groin region 2 days ago.  Swelling has significantly worsened over the last twenty four hours and she had a fever to 102 yesterday.  Has associated nausea.   Also reports pain to left upper arm at her nexplanon insertion site after hitting it two days ago.  She requests removal.       Home Medications Prior to Admission medications   Medication Sig Start Date End Date Taking? Authorizing Provider  doxycycline (VIBRAMYCIN) 100 MG capsule Take 1 capsule (100 mg total) by mouth 2 (two) times daily. 12/23/22  Yes Tilden Fossa, MD  oxyCODONE-acetaminophen (PERCOCET/ROXICET) 5-325 MG tablet Take 1 tablet by mouth every 6 (six) hours as needed for severe pain. 12/23/22  Yes Tilden Fossa, MD  Accu-Chek FastClix Lancets MISC Check sugar 6 x daily Patient not taking: Reported on 07/09/2022 10/21/18   Gretchen Short, NP  acetone, urine, test strip Check ketones per protocol Patient not taking: Reported on 07/09/2022 11/01/14   Mittie Bodo, MD  blood glucose meter kit and supplies Dispense based on patient and insurance preference. Use up to four times daily as directed. (FOR ICD-10 E10.9, E11.9). Patient not taking: Reported on 07/09/2022 10/20/21   Gailen Shelter, PA  Blood Glucose Monitoring Suppl (ACCU-CHEK GUIDE ME) w/Device KIT 1 Device by Does not apply route 3 (three) times daily. 07/09/22   Shamleffer, Konrad Dolores, MD  dicyclomine (BENTYL) 20 MG tablet Take 1 tablet (20 mg total) by mouth 2  (two) times daily. 09/20/22   Achille Rich, PA-C  docusate sodium (COLACE) 100 MG capsule Take 1 capsule (100 mg total) by mouth 2 (two) times daily. Patient not taking: Reported on 07/09/2022 12/27/21   Noralee Stain, DO  Dulaglutide (TRULICITY) 0.75 MG/0.5ML SOPN Inject 0.75 mg into the skin once a week. 07/09/22   Shamleffer, Konrad Dolores, MD  emtricitabine-tenofovir (TRUVADA) 200-300 MG tablet Take 1 tablet by mouth daily. 12/25/21   Noralee Stain, DO  Etonogestrel [facility]) Inject into the skin.    [provider]  glucagon 1 MG injection Inject 1 mg into the muscle once as needed for up to 1 dose. Use for Severe Hypoglycemia . Inject 1 mg intramuscularly if unresponsive, unable to swallow, unconscious and/or has seizure 07/09/22   Shamleffer, Konrad Dolores, MD  glucose blood (ACCU-CHEK GUIDE) test strip 1 each by Other route 3 (three) times daily. Use as instructed 07/09/22   Shamleffer, Konrad Dolores, MD  insulin aspart (NOVOLOG FLEXPEN) 100 UNIT/ML FlexPen Max daily 30 units 07/09/22   Shamleffer, Konrad Dolores, MD  insulin glargine (LANTUS SOLOSTAR) 100 UNIT/ML Solostar Pen Inject 28 Units into the skin daily. 07/13/22   Shamleffer, Konrad Dolores, MD  Insulin Pen Needle (BD PEN NEEDLE NANO U/F) 32G X 4 MM MISC BD PEN NEEDLES- BRAND SPECIFIC. INJECT INSULIN VIA INSULIN PEN 6 X DAILY Patient not taking: Reported on 07/09/2022 10/20/21   Gailen Shelter, PA  Insulin Pen Needle (PEN NEEDLES  3/16") 31G X 5 MM MISC 1 each by Does not apply route in the morning, at noon, in the evening, and at bedtime. 07/09/22   Shamleffer, Konrad Dolores, MD  metFORMIN (GLUCOPHAGE) 1000 MG tablet Take 1 tablet (1,000 mg total) by mouth 2 (two) times daily. 07/09/22   Shamleffer, Konrad Dolores, MD  naproxen sodium (ALEVE) 220 MG tablet Take 440 mg by mouth 2 (two) times daily as needed (pain). Patient not taking: Reported on 07/09/2022    [provider]  ondansetron (ZOFRAN) 4 MG tablet  Take 1 tablet (4 mg total) by mouth every 6 (six) hours. 09/20/22   Achille Rich, PA-C  senna-docusate (SENOKOT-S) 8.6-50 MG tablet Take 1 tablet by mouth at bedtime as needed for mild constipation. 08/28/22   Long, Arlyss Repress, MD      Allergies    Patient has no known allergies.    Review of Systems   Review of Systems  All other systems reviewed and are negative.   Physical Exam Updated Vital Signs BP 132/81   Pulse 68   Temp 98 F (36.7 C)   Resp 16   SpO2 98%  Physical Exam Vitals and nursing note reviewed.  Constitutional:      Appearance: She is well-developed.  HENT:     Head: Normocephalic and atraumatic.  Cardiovascular:     Rate and Rhythm: Normal rate and regular rhythm.     Heart sounds: No murmur heard. Pulmonary:     Effort: Pulmonary effort is normal. No respiratory distress.     Breath sounds: Normal breath sounds.  Abdominal:     Palpations: Abdomen is soft.     Tenderness: There is no abdominal tenderness. There is no guarding or rebound.  Genitourinary:    Comments: Soft tissue swelling and induration to right inguinal region with local tenderness to palpation Musculoskeletal:        General: No tenderness.     Comments: Mild ttp over right upper arm at nexplanon site with palpable implant without surrounding erythema or induration .  Skin:    General: Skin is warm and dry.  Neurological:     Mental Status: She is alert and oriented to person, place, and time.  Psychiatric:        Behavior: Behavior normal.     ED Results / Procedures / Treatments   Labs (all labs ordered are listed, but only abnormal results are displayed) Labs Reviewed  BASIC METABOLIC PANEL - Abnormal; Notable for the following components:      Result Value   CO2 20 (*)    Glucose, Bld 320 (*)    All other components within normal limits  CBC WITH DIFFERENTIAL/PLATELET  HCG, SERUM, QUALITATIVE  CBG MONITORING, ED    EKG None  Radiology No results  found.  Procedures .Marland KitchenIncision and Drainage  Date/Time: 12/23/2022 2:59 AM  Performed by: Tilden Fossa, MD Authorized by: Tilden Fossa, MD   Consent:    Consent obtained:  Verbal   Consent given by:  Patient   Risks discussed:  Bleeding, incomplete drainage, pain, infection and damage to other organs Universal protocol:    Patient identity confirmed:  Verbally with patient Location:    Type:  Abscess   Location:  Anogenital   Anogenital location: just medial to right inguinal crease. Pre-procedure details:    Skin preparation:  Povidone-iodine Sedation:    Sedation type:  None Anesthesia:    Anesthesia method:  Topical application and local infiltration   Topical  anesthetic:  EMLA cream   Local anesthetic:  Lidocaine 2% WITH epi Procedure type:    Complexity:  Complex Procedure details:    Incision types:  Single straight   Wound management:  Probed and deloculated, irrigated with saline and extensive cleaning   Drainage:  Purulent   Drainage amount:  Copious   Wound treatment:  Wound left open   Packing materials:  None Post-procedure details:    Procedure completion:  Tolerated     Medications Ordered in ED Medications  lactated ringers bolus 1,000 mL (0 mLs Intravenous Stopped 12/23/22 0323)  ondansetron (ZOFRAN) injection 4 mg (4 mg Intravenous Given 12/23/22 0105)  fentaNYL (SUBLIMAZE) injection 50 mcg (50 mcg Intravenous Given 12/23/22 0105)  lidocaine-prilocaine (EMLA) cream (1 Application Topical Given 12/23/22 0106)  lidocaine-EPINEPHrine (XYLOCAINE W/EPI) 2 %-1:200000 (PF) injection 20 mL (20 mLs Infiltration Given 12/23/22 0106)  ketorolac (TORADOL) 15 MG/ML injection 15 mg (15 mg Intravenous Given 12/23/22 0232)  doxycycline (VIBRA-TABS) tablet 100 mg (100 mg Oral Given 12/23/22 0981)    ED Course/ Medical Decision Making/ A&P                             Medical Decision Making Amount and/or Complexity of Data Reviewed Labs:  ordered.  Risk Prescription drug management.   Patient here for evaluation of right inguinal swelling.  She does have an abscess on examination, approximately 3 cm in diameter.  There is mild surrounding cellulitis.  Labs significant for hyperglycemia, no evidence of DKA with normal anion gap.  CBC without leukocytosis.  She was treated with pain medications, IV fluids and I&D was performed per note.  No evidence of necrotizing soft tissue infection, sepsis.  Discussed with patient home care for inguinal abscess.  Will start antibiotics.  Discussed outpatient follow-up and return precautions.    In terms of patient's request for Nexplanon removal-provided information for [facility] for outpatient removal of Nexplanon.  No evidence of local infection at insertion site.        Final Clinical Impression(s) / ED Diagnoses Final diagnoses:  Inguinal abscess  Hyperglycemia    Rx / DC Orders ED Discharge Orders          Ordered    doxycycline (VIBRAMYCIN) 100 MG capsule  2 times daily        12/23/22 0258    oxyCODONE-acetaminophen (PERCOCET/ROXICET) 5-325 MG tablet  Every 6 hours PRN        12/23/22 0258              Tilden Fossa, MD 12/23/22 [phone]

## 2022-12-23 NOTE — ED Notes (Signed)
Meds given, line started, EMLA applied to affected area. Suture cart at bedside.

## 2022-12-23 NOTE — Progress Notes (Deleted)
Name: Danielle Norman  MRN/ DOB: [id number], 2000/06/20   Age/ Sex: 22 y.o., female    PCP: Patient, No Pcp Per   Reason for Endocrinology Evaluation: Type 2 Diabetes Mellitus     Date of Initial Endocrinology Visit: 07/09/2022    PATIENT IDENTIFIER: Ms. Danielle Norman is a 22 y.o. female with a past medical history of DM,. The patient presented for initial endocrinology clinic visit on 07/08/2022 for consultative assistance with her diabetes management.    HPI: [name] was    Diagnosed with DM 2016 Prior Medications tried/Intolerance: as listed  Currently checking blood sugars 0 x / day,  before breakfast .   Hemoglobin A1c has ranged from 6.5% in 2016, peaking at 14.7% in 2023.  In terms of diet, the patient eats twice a day, snacks 1x a day. 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 She checks her blood sugars *** times daily. The patient has *** had hypoglycemic episodes since the last clinic visit. The patient is *** symptomatic with these episodes.  She presented to ED with abdominal pain 09/2022    HOME DIABETES REGIMEN: Metformin 1000 mg BID Trulicity 0.75 mg weekly Tresiba 28 units daily  CF: Novolog (BG-130/30)   Statin: no ACE-I/ARB: no Prior Diabetic Education: yes   METER DOWNLOAD SUMMARY: n/a   DIABETIC COMPLICATIONS: Microvascular complications:  neuropathy Denies: CKD Last eye exam: can't remember   Macrovascular complications:   Denies: CAD, PVD, CVA   PAST HISTORY: Past Medical History:  Past Medical History:  Diagnosis Date   Anxiety    Diabetes mellitus, type 2 (HCC)    Diagnosed 10/2014, hbA1c 14.1% at  diagnosis.  Started on insulin and metformin   Heart murmur    Past Surgical History:  Past Surgical History:  Procedure Laterality Date   ABCESS DRAINAGE     buttucks   EYE MUSCLE SURGERY     I & D EXTREMITY Left 12/21/2021   Procedure: IRRIGATION AND DEBRIDEMENT, KNEE;  Surgeon: Samson Frederic, MD;  Location: [facility] OR;  Service: Orthopedics;  Laterality: Left;    Social History:  reports that she has never smoked. She has been exposed to tobacco smoke. She has never used smokeless tobacco. She reports current drug use. Drug: Marijuana. She reports that she does not drink alcohol. Family History:  Family History  Problem Relation Age of Onset   Kidney disease Father    Diabetes Father        Reported as type 1 diabetes   Diabetes Maternal Aunt        type 2 diabetes, was on oral meds, now diet controlled   Thyroid disease Maternal Grandmother      HOME MEDICATIONS: Allergies as of 12/23/2022   No Known Allergies      Medication List        Accurate as of December 23, 2022  7:10 AM. If you have any questions, ask your nurse or doctor.          Accu-Chek FastClix Lancets Misc Check sugar 6 x daily   Accu-Chek Guide Me w/Device Kit 1 Device by Does not apply route 3 (  three) times daily.   Accu-Chek Guide test strip Generic drug: glucose blood 1 each by Other route 3 (three) times daily. Use as instructed   acetone (urine) test strip Check ketones per protocol   BD Pen Needle Nano U/F 32G X 4 MM Misc Generic drug: Insulin Pen Needle BD PEN NEEDLES- BRAND SPECIFIC. INJECT INSULIN VIA INSULIN PEN 6 X DAILY   Pen Needles 3/16" 31G X 5 MM Misc 1 each by Does not apply route in the morning, at noon, in the evening, and at bedtime.   blood glucose meter kit and supplies Dispense based on patient and insurance preference. Use up to four times daily as directed. (FOR ICD-10 E10.9, E11.9).   dicyclomine 20 MG tablet Commonly known as: BENTYL Take 1 tablet (20 mg total) by  mouth 2 (two) times daily.   docusate sodium 100 MG capsule Commonly known as: COLACE Take 1 capsule (100 mg total) by mouth 2 (two) times daily.   doxycycline 100 MG capsule Commonly known as: VIBRAMYCIN Take 1 capsule (100 mg total) by mouth 2 (two) times daily.   glucagon 1 MG injection Inject 1 mg into the muscle once as needed for up to 1 dose. Use for Severe Hypoglycemia . Inject 1 mg intramuscularly if unresponsive, unable to swallow, unconscious and/or has seizure   Lantus SoloStar 100 UNIT/ML Solostar Pen Generic drug: insulin glargine Inject 28 Units into the skin daily.   metFORMIN 1000 MG tablet Commonly known as: GLUCOPHAGE Take 1 tablet (1,000 mg total) by mouth 2 (two) times daily.   naproxen sodium 220 MG tablet Commonly known as: ALEVE Take 440 mg by mouth 2 (two) times daily as needed (pain).   NEXPLANON [place] Inject into the skin.   NovoLOG FlexPen 100 UNIT/ML FlexPen Generic drug: insulin aspart Max daily 30 units   ondansetron 4 MG tablet Commonly known as: ZOFRAN Take 1 tablet (4 mg total) by mouth every 6 (six) hours.   oxyCODONE-acetaminophen 5-325 MG tablet Commonly known as: PERCOCET/ROXICET Take 1 tablet by mouth every 6 (six) hours as needed for severe pain.   senna-docusate 8.6-50 MG tablet Commonly known as: Senokot-S Take 1 tablet by mouth at bedtime as needed for mild constipation.   Trulicity 0.75 MG/0.5ML Sopn Generic drug: Dulaglutide Inject 0.75 mg into the skin once a week.   Truvada 200-300 MG tablet Generic drug: emtricitabine-tenofovir Take 1 tablet by mouth daily.         ALLERGIES: No Known Allergies   REVIEW OF SYSTEMS: A comprehensive ROS was conducted with the patient and is negative except as per HPI    OBJECTIVE:   VITAL SIGNS: There were no vitals taken for this visit.   PHYSICAL EXAM:  General: Pt appears well and is in NAD  Neck: General: Supple without adenopathy or carotid bruits. Thyroid: Thyroid  size normal.  No goiter or nodules appreciated.   Lungs: Clear with good BS bilat   Heart: RRR   Abdomen:  soft, nontender  Extremities:  Lower extremities - No pretibial edema.   Neuro: MS is good with appropriate affect, pt is alert and Ox3    DM foot exam: 07/09/2022  The skin of the feet is intact without sores or ulcerations. The pedal pulses are 2+ on right and 2+ on left. The sensation is intact to a screening 5.07, 10 gram monofilament bilaterally    DATA REVIEWED:  Lab Results  Component Value Date   HGBA1C 14.9 (A) 07/09/2022   HGBA1C 14.7 (  H) 12/23/2021   HGBA1C 11.5 (A) 04/26/2018     Latest Reference Range & Units 12/23/22 01:00  Sodium 135 - 145 mmol/L 135  Potassium 3.5 - 5.1 mmol/L 3.6  Chloride 98 - 111 mmol/L 100  CO2 22 - 32 mmol/L 20 (L)  Glucose 70 - 99 mg/dL 657 (H)  BUN 6 - 20 mg/dL 8  Creatinine 8.46 - 9.62 mg/dL 9.52  Calcium 8.9 - 84.1 mg/dL 9.5  Anion gap 5 - 15  15  GFR, Estimated >60 mL/min >60     ASSESSMENT / PLAN / RECOMMENDATIONS:   1) Type 2 Diabetes Mellitus, Poorly controlled, With neuropathic  complications - Most recent A1c of 14.9 %. Goal A1c < 7.0 %.    Plan: GENERAL: I have discussed with the patient the pathophysiology of diabetes. We went over the natural progression of the disease. We stressed the importance of lifestyle changes. I explained the complications associated with diabetes including retinopathy, nephropathy, neuropathy as well as increased risk of cardiovascular disease. We went over the benefit seen with glycemic control.  I explained to the patient that diabetic patients are at higher than normal risk for amputations.  Prescribed dexcom and a sample provided  Will start novolog as below before each meal  Will start trulicity, cautioned against GI side effects, denies hx of pancreatitis  A prescription for Accu-Chek guide and extra strips have also been sent to the pharmacy Preconception counseling was done,  patient to avoid pregnancy unless A1c <7.0% she has no plans of conceiving in the near future  MEDICATIONS: Continue metformin 1000 mg twice daily Increase Tresiba to 28 units daily Start Trulicity 0.75 mg once weekly Start correction factor: NovoLog (BG -130/30)  EDUCATION / INSTRUCTIONS: BG monitoring instructions: Patient is instructed to check her blood sugars 3 times a day, before meals. Call [facility] if: BG persistently < 70  I reviewed the Rule of 15 for the treatment of hypoglycemia in detail with the patient. Literature supplied.   2) Diabetic complications:  Eye: Does not have known diabetic retinopathy.  Neuro/ Feet: Does have known diabetic peripheral neuropathy. Renal: Patient does not have known baseline CKD. She is not on an ACEI/ARB at present.      Signed electronically by: Lyndle Herrlich, MD  [facility] Endocrinology  [facility] [address] [place], [place] [address] Phone: [phone] FAX: [phone]   CC: Patient, No Pcp Per No address on file Phone: None  Fax: None    Return to Endocrinology clinic as below: Future Appointments  Date Time Provider Department Center  12/23/2022  9:50 AM Talasia Saulter, Konrad Dolores, MD [id number] None

## 2023-05-20 ENCOUNTER — Other Ambulatory Visit: Payer: Self-pay

## 2023-05-20 ENCOUNTER — Emergency Department (HOSPITAL_COMMUNITY)
Admission: EM | Admit: 2023-05-20 | Discharge: 2023-05-21 | Disposition: A | Discharge status: Home / Self Care | Payer: Medicaid Other | Attending: Emergency Medicine | Admitting: Emergency Medicine

## 2023-05-20 ENCOUNTER — Encounter (HOSPITAL_COMMUNITY): Payer: Self-pay

## 2023-05-20 DIAGNOSIS — Z202 Contact with and (suspected) exposure to infections with a predominantly sexual mode of transmission: Secondary | ICD-10-CM

## 2023-05-20 DIAGNOSIS — N760 Acute vaginitis: Secondary | ICD-10-CM | POA: Diagnosis not present

## 2023-05-20 DIAGNOSIS — Z7984 Long term (current) use of oral hypoglycemic drugs: Secondary | ICD-10-CM | POA: Insufficient documentation

## 2023-05-20 DIAGNOSIS — D72829 Elevated white blood cell count, unspecified: Secondary | ICD-10-CM | POA: Diagnosis not present

## 2023-05-20 DIAGNOSIS — R000 Tachycardia, unspecified: Secondary | ICD-10-CM | POA: Diagnosis not present

## 2023-05-20 DIAGNOSIS — E1165 Type 2 diabetes mellitus with hyperglycemia: Secondary | ICD-10-CM | POA: Insufficient documentation

## 2023-05-20 DIAGNOSIS — B3731 Acute candidiasis of vulva and vagina: Secondary | ICD-10-CM | POA: Diagnosis not present

## 2023-05-20 DIAGNOSIS — Z794 Long term (current) use of insulin: Secondary | ICD-10-CM | POA: Insufficient documentation

## 2023-05-20 DIAGNOSIS — L0291 Cutaneous abscess, unspecified: Secondary | ICD-10-CM

## 2023-05-20 DIAGNOSIS — E876 Hypokalemia: Secondary | ICD-10-CM | POA: Insufficient documentation

## 2023-05-20 DIAGNOSIS — L02411 Cutaneous abscess of right axilla: Secondary | ICD-10-CM | POA: Diagnosis present

## 2023-05-20 DIAGNOSIS — B9689 Other specified bacterial agents as the cause of diseases classified elsewhere: Secondary | ICD-10-CM | POA: Diagnosis not present

## 2023-05-20 NOTE — ED Triage Notes (Signed)
Pt arrived from home via POV c/o abscess right axilla x 4 days. Pt also states that she would like to have an std check.

## 2023-05-21 LAB — HIV ANTIBODY (ROUTINE TESTING W REFLEX): HIV Screen 4th Generation wRfx: NONREACTIVE

## 2023-05-21 LAB — URINALYSIS, ROUTINE W REFLEX MICROSCOPIC
Bacteria, UA: NONE SEEN
Bilirubin Urine: NEGATIVE
Glucose, UA: >=500 mg/dL — AB
Hgb urine dipstick: NEGATIVE
Ketones, ur: 20 mg/dL — AB
Leukocytes,Ua: NEGATIVE
Nitrite: NEGATIVE
Protein, ur: NEGATIVE mg/dL
Specific Gravity, Urine: 1.037 — ABNORMAL HIGH (ref 1.005–1.030)
pH: 7 (ref 5.0–8.0)

## 2023-05-21 LAB — BASIC METABOLIC PANEL
Anion gap: 12 (ref 5–15)
BUN: 9 mg/dL (ref 6–20)
CO2: 19 mmol/L — ABNORMAL LOW (ref 22–32)
Calcium: 9 mg/dL (ref 8.9–10.3)
Chloride: 97 mmol/L — ABNORMAL LOW (ref 98–111)
Creatinine, Ser: 0.61 mg/dL (ref 0.44–1.00)
GFR, Estimated: >60 mL/min (ref >60)
Glucose, Bld: 409 mg/dL — ABNORMAL HIGH (ref 70–99)
Potassium: 3.5 mmol/L (ref 3.5–5.1)
Sodium: 128 mmol/L — ABNORMAL LOW (ref 135–145)

## 2023-05-21 LAB — WET PREP, GENITAL
Sperm: NONE SEEN
Trich, Wet Prep: NONE SEEN
WBC, Wet Prep HPF POC: >=10 — AB (ref <10)

## 2023-05-21 LAB — CBC WITH DIFFERENTIAL/PLATELET
Abs Immature Granulocytes: 0.02 10*3/uL (ref 0.00–0.07)
Basophils Absolute: 0 10*3/uL (ref 0.0–0.1)
Basophils Relative: 0 %
Eosinophils Absolute: 0 10*3/uL (ref 0.0–0.5)
Eosinophils Relative: 0 %
HCT: 37.4 % (ref 36.0–46.0)
Hemoglobin: 12.7 g/dL (ref 12.0–15.0)
Immature Granulocytes: 0 %
Lymphocytes Relative: 27 %
Lymphs Abs: 3.1 10*3/uL (ref 0.7–4.0)
MCH: 27.8 pg (ref 26.0–34.0)
MCHC: 34 g/dL (ref 30.0–36.0)
MCV: 81.8 fL (ref 80.0–100.0)
Monocytes Absolute: 0.8 10*3/uL (ref 0.1–1.0)
Monocytes Relative: 7 %
Neutro Abs: 7.5 10*3/uL (ref 1.7–7.7)
Neutrophils Relative %: 66 %
Platelets: 304 10*3/uL (ref 150–400)
RBC: 4.57 MIL/uL (ref 3.87–5.11)
RDW: 12.1 % (ref 11.5–15.5)
WBC: 11.4 10*3/uL — ABNORMAL HIGH (ref 4.0–10.5)
nRBC: 0 % (ref 0.0–0.2)

## 2023-05-21 LAB — I-STAT CG4 LACTIC ACID, ED
Lactic Acid, Venous: 1.3 mmol/L (ref 0.5–1.9)
Lactic Acid, Venous: 0.8 mmol/L (ref 0.5–1.9)

## 2023-05-21 LAB — RPR: RPR Ser Ql: NONREACTIVE

## 2023-05-21 LAB — HCG, SERUM, QUALITATIVE: Preg, Serum: NEGATIVE

## 2023-05-21 MED ORDER — METRONIDAZOLE 500 MG PO TABS
500.0000 mg | ORAL_TABLET | Freq: Once | ORAL | Status: AC
  Administered 2023-05-21: 500 mg via ORAL
  Filled 2023-05-21: qty 1

## 2023-05-21 MED ORDER — MORPHINE SULFATE (PF) 4 MG/ML IV SOLN
4.0000 mg | Freq: Once | INTRAVENOUS | Status: AC
  Administered 2023-05-21: 4 mg via INTRAMUSCULAR
  Filled 2023-05-21: qty 1

## 2023-05-21 MED ORDER — HYDROCODONE-ACETAMINOPHEN 5-325 MG PO TABS
1.0000 | ORAL_TABLET | Freq: Once | ORAL | Status: AC
  Administered 2023-05-21: 1 via ORAL
  Filled 2023-05-21: qty 1

## 2023-05-21 MED ORDER — LACTATED RINGERS IV BOLUS: 1000.0000 mL | Freq: Once | INTRAVENOUS | Status: DC

## 2023-05-21 MED ORDER — HYDROCODONE-ACETAMINOPHEN 5-325 MG PO TABS: 1.0000 | ORAL_TABLET | Freq: Four times a day (QID) | ORAL | 0 refills | Status: AC | PRN

## 2023-05-21 MED ORDER — ONDANSETRON HCL 4 MG/2ML IJ SOLN
4.0000 mg | Freq: Once | INTRAMUSCULAR | Status: AC
  Administered 2023-05-21: 4 mg via INTRAVENOUS
  Filled 2023-05-21: qty 2

## 2023-05-21 MED ORDER — MORPHINE SULFATE (PF) 4 MG/ML IV SOLN: 4.0000 mg | Freq: Once | INTRAVENOUS | Status: DC

## 2023-05-21 MED ORDER — DOXYCYCLINE HYCLATE 100 MG PO CAPS: 100.0000 mg | ORAL_CAPSULE | Freq: Two times a day (BID) | ORAL | 0 refills | Status: AC

## 2023-05-21 MED ORDER — DOXYCYCLINE HYCLATE 100 MG PO TABS
100.0000 mg | ORAL_TABLET | Freq: Once | ORAL | Status: AC
  Administered 2023-05-21: 100 mg via ORAL
  Filled 2023-05-21: qty 1

## 2023-05-21 MED ORDER — METRONIDAZOLE 500 MG PO TABS: 500.0000 mg | ORAL_TABLET | Freq: Two times a day (BID) | ORAL | 0 refills | Status: AC

## 2023-05-21 MED ORDER — SODIUM CHLORIDE 0.9 % IV BOLUS
1000.0000 mL | Freq: Once | INTRAVENOUS | Status: AC
  Administered 2023-05-21: 1000 mL via INTRAVENOUS

## 2023-05-21 MED ORDER — MORPHINE SULFATE (PF) 2 MG/ML IV SOLN
2.0000 mg | Freq: Once | INTRAVENOUS | Status: AC
  Administered 2023-05-21: 2 mg via INTRAVENOUS
  Filled 2023-05-21: qty 1

## 2023-05-21 MED ORDER — FLUCONAZOLE 150 MG PO TABS
150.0000 mg | ORAL_TABLET | Freq: Once | ORAL | Status: AC
  Administered 2023-05-21: 150 mg via ORAL
  Filled 2023-05-21: qty 1

## 2023-05-21 MED ORDER — LIDOCAINE-EPINEPHRINE (PF) 2 %-1:200000 IJ SOLN
10.0000 mL | Freq: Once | INTRAMUSCULAR | Status: AC
  Administered 2023-05-21: 10 mL via INTRADERMAL
  Filled 2023-05-21: qty 20

## 2023-05-21 MED ORDER — CEFTRIAXONE SODIUM 500 MG IJ SOLR
500.0000 mg | Freq: Once | INTRAMUSCULAR | Status: AC
  Administered 2023-05-21: 500 mg via INTRAMUSCULAR
  Filled 2023-05-21: qty 500

## 2023-05-21 NOTE — ED Provider Notes (Signed)
[place] EMERGENCY DEPARTMENT AT [facility] Provider Note   CSN: [id number] Arrival date & time: 05/20/23  2205     History  Chief Complaint  Patient presents with   Abscess   Exposure to STD    Kiah BUSRA DELAUGHTER is a 22 y.o. female history of type 2 diabetes, abscesses, anxiety presenting with right axilla abscess that has been present for the past few days.  Patient was before that she had a left axilla abscess that drained on its own but notes that she had a temperature of 103 at home.  Patient has not been afebrile since the left axilla abscess drained.  Patient denies any diagnosis of HS. denies abdominal pain, chest pain, shortness of breath, nausea/vomiting.  Patient does note that she also wants have STD check as she went out with her female friends the other night got intoxicated, and woke up with vaginal itching but denies any vaginal discharge or dysuria or hematuria.  Patient states that she does not think she had sexual intercourse.   Home Medications Prior to Admission medications   Medication Sig Start Date End Date Taking? Authorizing Provider  Accu-Chek FastClix Lancets MISC Check sugar 6 x daily 10/21/18  Yes Gretchen Short, NP  blood glucose meter kit and supplies Dispense based on patient and insurance preference. Use up to four times daily as directed. (FOR ICD-10 E10.9, E11.9). 10/20/21  Yes Fondaw, Wylder S, PA  Blood Glucose Monitoring Suppl (ACCU-CHEK GUIDE ME) w/Device KIT 1 Device by Does not apply route 3 (three) times daily. 07/09/22  Yes Shamleffer, Konrad Dolores, MD  doxycycline (VIBRAMYCIN) 100 MG capsule Take 1 capsule (100 mg total) by mouth 2 (two) times daily for 7 days. 05/21/23 05/28/23 Yes Kyree Fedorko, Beverly Gust, PA-C  glucagon 1 MG injection Inject 1 mg into the muscle once as needed for up to 1 dose. Use for Severe Hypoglycemia . Inject 1 mg intramuscularly if unresponsive, unable to swallow, unconscious and/or has seizure 07/09/22  Yes  Shamleffer, Konrad Dolores, MD  glucose blood (ACCU-CHEK GUIDE) test strip 1 each by Other route 3 (three) times daily. Use as instructed 07/09/22  Yes Shamleffer, Konrad Dolores, MD  HYDROcodone-acetaminophen (NORCO) 5-325 MG tablet Take 1 tablet by mouth every 6 (six) hours as needed for up to 3 days for moderate pain (pain score 4-6). 05/21/23 05/24/23 Yes Sultana Tierney, Beverly Gust, PA-C  insulin aspart (NOVOLOG FLEXPEN) 100 UNIT/ML FlexPen Max daily 30 units Patient taking differently: 30-60 Units See admin instructions. Per blood glucose reading. Max 60 units 07/09/22  Yes Shamleffer, Konrad Dolores, MD  Insulin Degludec (TRESIBA) 100 UNIT/ML SOLN Inject 48 Units into the skin daily.   Yes [provider]  Insulin Pen Needle (BD PEN NEEDLE NANO U/F) 32G X 4 MM MISC BD PEN NEEDLES- BRAND SPECIFIC. INJECT INSULIN VIA INSULIN PEN 6 X DAILY 10/20/21  Yes Fondaw, Wylder S, PA  Insulin Pen Needle (PEN NEEDLES 3/16") 31G X 5 MM MISC 1 each by Does not apply route in the morning, at noon, in the evening, and at bedtime. 07/09/22  Yes Shamleffer, Konrad Dolores, MD  metFORMIN (GLUCOPHAGE) 1000 MG tablet Take 1 tablet (1,000 mg total) by mouth 2 (two) times daily. 07/09/22  Yes Shamleffer, Konrad Dolores, MD  metroNIDAZOLE (FLAGYL) 500 MG tablet Take 1 tablet (500 mg total) by mouth 2 (two) times daily. 05/21/23  Yes Evlyn Kanner T, PA-C  acetone, urine, test strip Check ketones per protocol Patient not taking: Reported on 07/09/2022 11/01/14  Mittie Bodo, MD  dicyclomine (BENTYL) 20 MG tablet Take 1 tablet (20 mg total) by mouth 2 (two) times daily. Patient not taking: Reported on 05/21/2023 09/20/22   Achille Rich, PA-C  docusate sodium (COLACE) 100 MG capsule Take 1 capsule (100 mg total) by mouth 2 (two) times daily. Patient not taking: Reported on 07/09/2022 12/27/21   Noralee Stain, DO  Dulaglutide (TRULICITY) 0.75 MG/0.5ML SOPN Inject 0.75 mg into the skin once a week. Patient not taking:  Reported on 05/21/2023 07/09/22   Shamleffer, Konrad Dolores, MD  emtricitabine-tenofovir (TRUVADA) 200-300 MG tablet Take 1 tablet by mouth daily. Patient not taking: Reported on 05/21/2023 12/25/21   Noralee Stain, DO  insulin glargine (LANTUS SOLOSTAR) 100 UNIT/ML Solostar Pen Inject 28 Units into the skin daily. Patient not taking: Reported on 05/21/2023 07/13/22   Shamleffer, Konrad Dolores, MD  naproxen sodium (ALEVE) 220 MG tablet Take 440 mg by mouth 2 (two) times daily as needed (pain). Patient not taking: Reported on 07/09/2022    [provider]  ondansetron (ZOFRAN) 4 MG tablet Take 1 tablet (4 mg total) by mouth every 6 (six) hours. Patient not taking: Reported on 05/21/2023 09/20/22   Achille Rich, PA-C  senna-docusate (SENOKOT-S) 8.6-50 MG tablet Take 1 tablet by mouth at bedtime as needed for mild constipation. Patient not taking: Reported on 05/21/2023 08/28/22   Long, Arlyss Repress, MD      Allergies    Patient has no known allergies.    Review of Systems   Review of Systems  Physical Exam Updated Vital Signs BP (!) 133/94   Pulse 100   Temp 98.6 F (37 C) (Oral)   Resp 18   LMP  (LMP Unknown)   SpO2 100%  Physical Exam Constitutional:      General: She is not in acute distress. Cardiovascular:     Rate and Rhythm: Regular rhythm. Tachycardia present.     Pulses: Normal pulses.     Heart sounds: Normal heart sounds.  Pulmonary:     Effort: Pulmonary effort is normal. No respiratory distress.     Breath sounds: Normal breath sounds.  Abdominal:     Palpations: Abdomen is soft.     Tenderness: There is no abdominal tenderness. There is no guarding or rebound.  Musculoskeletal:     Cervical back: Normal range of motion.  Skin:    General: Skin is warm and dry.     Capillary Refill: Capillary refill takes less than 2 seconds.     Comments: 4 cm area of fluctuance noted in the right axilla is tender to palpation Unable to express out any purulent material   Neurological:     Mental Status: She is alert and oriented to person, place, and time.  Psychiatric:        Mood and Affect: Mood normal.     ED Results / Procedures / Treatments   Labs (all labs ordered are listed, but only abnormal results are displayed) Labs Reviewed  WET PREP, GENITAL - Abnormal; Notable for the following components:      Result Value   Yeast Wet Prep HPF POC PRESENT (*)    Clue Cells Wet Prep HPF POC PRESENT (*)    WBC, Wet Prep HPF POC >=10 (*)    All other components within normal limits  URINALYSIS, ROUTINE W REFLEX MICROSCOPIC - Abnormal; Notable for the following components:   Color, Urine STRAW (*)    Specific Gravity, Urine 1.037 (*)  Glucose, UA >=500 (*)    Ketones, ur 20 (*)    All other components within normal limits  BASIC METABOLIC PANEL - Abnormal; Notable for the following components:   Sodium 128 (*)    Chloride 97 (*)    CO2 19 (*)    Glucose, Bld 409 (*)    All other components within normal limits  CBC WITH DIFFERENTIAL/PLATELET - Abnormal; Notable for the following components:   WBC 11.4 (*)    All other components within normal limits  CULTURE, BLOOD (ROUTINE X 2)  CULTURE, BLOOD (ROUTINE X 2)  HIV ANTIBODY (ROUTINE TESTING W REFLEX)  RPR  HCG, SERUM, QUALITATIVE  I-STAT CG4 LACTIC ACID, ED  I-STAT CG4 LACTIC ACID, ED  GC/CHLAMYDIA PROBE AMP ([place]) NOT AT [facility]    EKG None  Radiology No results found.  Procedures .Ultrasound ED Soft Tissue  Date/Time: 05/21/2023 7:47 AM  Performed by: Netta Corrigan, PA-C Authorized by: Netta Corrigan, PA-C   Procedure details:    Indications: localization of abscess     Transverse view:  Visualized   Longitudinal view:  Visualized   Images: archived     Limitations:  Body habitus Location:    Location: axilla     Side:  Right Findings:     abscess present    no cellulitis present .Incision and Drainage  Date/Time: 05/21/2023 9:31 AM  Performed by: Netta Corrigan, PA-C Authorized by: Netta Corrigan, PA-C   Consent:    Consent obtained:  Verbal   Consent given by:  Patient   Risks, benefits, and alternatives were discussed: yes     Risks discussed:  Damage to other organs, bleeding, incomplete drainage, infection and pain Universal protocol:    Procedure explained and questions answered to patient or proxy's satisfaction: yes     Test results available : yes     Patient identity confirmed:  Verbally with patient Location:    Type:  Abscess   Size:  4 cm   Location: Right axilla. Pre-procedure details:    Skin preparation:  Chlorhexidine with alcohol Sedation:    Sedation type:  None Anesthesia:    Anesthesia method:  Local infiltration   Local anesthetic:  Lidocaine 2% WITH epi Procedure type:    Complexity:  Complex Procedure details:    Incision types:  Single straight   Incision depth:  Dermal   Wound management:  Probed and deloculated, irrigated with saline and extensive cleaning   Drainage:  Bloody and purulent   Drainage amount:  Copious   Wound treatment:  Wound left open   Packing materials:  1/2 in iodoform gauze   Amount 1/2" iodoform:  5 inches     Medications Ordered in ED Medications  fluconazole (DIFLUCAN) tablet 150 mg (has no administration in time range)  metroNIDAZOLE (FLAGYL) tablet 500 mg (has no administration in time range)  morphine (PF) 4 MG/ML injection 4 mg (4 mg Intramuscular Given 05/21/23 0733)  lidocaine-EPINEPHrine (XYLOCAINE W/EPI) 2 %-1:200000 (PF) injection 10 mL (10 mLs Intradermal Given 05/21/23 0750)  sodium chloride 0.9 % bolus 1,000 mL (0 mLs Intravenous Stopped 05/21/23 1100)  cefTRIAXone (ROCEPHIN) injection 500 mg (500 mg Intramuscular Given 05/21/23 1104)  HYDROcodone-acetaminophen (NORCO/VICODIN) 5-325 MG per tablet 1 tablet (1 tablet Oral Given 05/21/23 1103)  morphine (PF) 2 MG/ML injection 2 mg (2 mg Intravenous Given 05/21/23 1101)  doxycycline (VIBRA-TABS) tablet 100 mg (100 mg  Oral Given 05/21/23 1103)  ondansetron (ZOFRAN) injection 4  mg (4 mg Intravenous Given 05/21/23 1114)    ED Course/ Medical Decision Making/ A&P                                 Medical Decision Making Amount and/or Complexity of Data Reviewed Labs: ordered.  Risk Prescription drug management.   Jayci S Pultz 22 y.o. presented today for an abscess.  Working DDx that I considered at this time includes, but not limited to, abscess, cellulitis, erysipelas, HS, folliculitis, lymphangitis, necrotizing fasciitis/cellulitis, Fournier's, DVT, BV, syphilis, HIV, GC chlamydia, candidiasis vaginalis.  R/o DDx: cellulitis, erysipelas, HS, folliculitis, lymphangitis, necrotizing fasciitis/cellulitis, Fournier's, DVT, HIV, syphilis: These are considered less likely due to history of present illness, physical exam, labs/imaging findings  Review of prior external notes: 09/20/2022 ED  Unique Tests and My Interpretation:  CBC: Leukocytosis 11.4 BMP: Pseudohyponatremia 128, glucose 409 no anion gap RPR: Nonreactive HIV: Nonreactive UA: Glucosuria Wet prep: Yeast and clue cells present GC chlamydia: Pending I-STAT lactic acid: Unremarkable Blood cultures: Pending hCG serum qualitative negative  Social Determinants of Health: none  Discussion with Independent Historian: None  Discussion of Management of Tests: None  Risk: Medium: Prescription medication use  Risk Stratification Score: None  Plan: On exam patient was in no acute distress but noted to be tachycardic at 116 in the room but otherwise with stable vitals.  Patient does have right axilla abscess on exam that was confirmed with bedside ultrasound.   I discussed with the patient the risks and benefit of doing an incision and drainage and patient with full decision-making capacity verbalized their understanding symptoms and want to proceed with incision and drainage.  I&D was performed and does show purulent material which does  indicate antibiotic use.  Will give patient 1 dose of doxycycline here and prescribe the rest and follow-up with his primary care provider.  I spoke with patient following up in 2 days for wound check.  I discussed with the patient red flag symptoms to be aware of including fevers, worsening of abscess, etc. patient verbalized understanding of this.  I encouraged patient to use heat packs to help with the draining.  Patient's glucose was elevated at 409 however there is no gap to indicate DKA.  Will give a liter of fluid and encouraged her to continue taking her diabetes medications and to follow-up with her primary care provider.  Patient does request to be seen by an endocrinologist for diabetes and at this time this is reasonable so we will give ambulatory referral as well.  The rest patient's labs do show clue cells and yeast cells and so patient was given Diflucan along with Flagyl.  Will prescribe the rest of the Flagyl.  Patient was given 1 dose of the doxycycline here and encouraged to continue taking this medication for her abscess.  This medication will also cover her for chlamydia as well but encouraged patient to follow-up with the MyChart results in the next few days.  Will give patient a few days worth of pain meds as well for the abscess.  Patient was given return precautions. Patient stable for discharge at this time.  Patient verbalized understanding of plan.  This chart was dictated using voice recognition software.  Despite best efforts to proofread,  errors can occur which can change the documentation meaning.         Final Clinical Impression(s) / ED Diagnoses Final diagnoses:  Abscess  Hyperglycemia due  to diabetes mellitus (HCC)  Possible exposure to STD  Vaginal yeast infection  Bacterial vaginosis    Rx / DC Orders ED Discharge Orders          Ordered    Ambulatory referral to Endocrinology        05/21/23 1040    doxycycline (VIBRAMYCIN) 100 MG capsule  2 times  daily        05/21/23 1058    metroNIDAZOLE (FLAGYL) 500 MG tablet  2 times daily        05/21/23 1147    HYDROcodone-acetaminophen (NORCO) 5-325 MG tablet  Every 6 hours PRN        05/21/23 1148              Remi Deter 05/21/23 1151    Jacalyn Lefevre, MD 05/21/23 1327

## 2023-05-21 NOTE — Discharge Instructions (Addendum)
Please follow-up with your primary care provider in regards to recent symptoms and ER visit.  Today you had an abscess that was drained however you will need to return in 2 days to have wound check and have the packing taken out.  In the meantime I have given you antibiotics to take for the abscess to prevent infection.  At your request I am referring you to an endocrinologist.  Please continue to take your diabetes medications as prescribed.  Please use heat packs on the abscess and monitor the abscess.  You may also follow-up on the MyChart app in regards to your gonorrhea and Chlamydia and other STD results.  Today you are also treated for yeast infection and I have prescribed for you Flagyl to take for bacterial vaginosis.  Please do not drink alcohol on this medication as it will give you a very upset stomach.  If symptoms change or worsen please return to the ER.

## 2023-05-24 LAB — GC/CHLAMYDIA PROBE AMP (~~LOC~~) NOT AT ARMC
Chlamydia: NEGATIVE
Comment: NEGATIVE
Comment: NORMAL
Neisseria Gonorrhea: NEGATIVE

## 2023-05-26 LAB — CULTURE, BLOOD (ROUTINE X 2)
Culture: NO GROWTH
Culture: NO GROWTH
Special Requests: ADEQUATE
Special Requests: ADEQUATE

## 2023-07-14 ENCOUNTER — Emergency Department (HOSPITAL_COMMUNITY)
Admission: EM | Admit: 2023-07-14 | Discharge: 2023-07-15 | Disposition: A | Discharge status: Home / Self Care | Payer: Medicaid Other | Attending: Emergency Medicine | Admitting: Emergency Medicine

## 2023-07-14 ENCOUNTER — Other Ambulatory Visit: Payer: Self-pay

## 2023-07-14 ENCOUNTER — Encounter (HOSPITAL_COMMUNITY): Payer: Self-pay | Admitting: *Deleted

## 2023-07-14 DIAGNOSIS — E119 Type 2 diabetes mellitus without complications: Secondary | ICD-10-CM | POA: Diagnosis not present

## 2023-07-14 DIAGNOSIS — L02411 Cutaneous abscess of right axilla: Secondary | ICD-10-CM | POA: Insufficient documentation

## 2023-07-14 DIAGNOSIS — L0291 Cutaneous abscess, unspecified: Secondary | ICD-10-CM

## 2023-07-14 LAB — BASIC METABOLIC PANEL
Anion gap: 14 (ref 5–15)
BUN: 7 mg/dL (ref 6–20)
CO2: 22 mmol/L (ref 22–32)
Calcium: 9.4 mg/dL (ref 8.9–10.3)
Chloride: 97 mmol/L — ABNORMAL LOW (ref 98–111)
Creatinine, Ser: 0.66 mg/dL (ref 0.44–1.00)
GFR, Estimated: >60 mL/min (ref >60)
Glucose, Bld: 360 mg/dL — ABNORMAL HIGH (ref 70–99)
Potassium: 3.3 mmol/L — ABNORMAL LOW (ref 3.5–5.1)
Sodium: 133 mmol/L — ABNORMAL LOW (ref 135–145)

## 2023-07-14 LAB — CBC WITH DIFFERENTIAL/PLATELET
Abs Immature Granulocytes: 0.05 10*3/uL (ref 0.00–0.07)
Basophils Absolute: 0 10*3/uL (ref 0.0–0.1)
Basophils Relative: 0 %
Eosinophils Absolute: 0.2 10*3/uL (ref 0.0–0.5)
Eosinophils Relative: 2 %
HCT: 39.9 % (ref 36.0–46.0)
Hemoglobin: 13.2 g/dL (ref 12.0–15.0)
Immature Granulocytes: 0 %
Lymphocytes Relative: 21 %
Lymphs Abs: 2.5 10*3/uL (ref 0.7–4.0)
MCH: 28.2 pg (ref 26.0–34.0)
MCHC: 33.1 g/dL (ref 30.0–36.0)
MCV: 85.3 fL (ref 80.0–100.0)
Monocytes Absolute: 0.9 10*3/uL (ref 0.1–1.0)
Monocytes Relative: 8 %
Neutro Abs: 7.9 10*3/uL — ABNORMAL HIGH (ref 1.7–7.7)
Neutrophils Relative %: 69 %
Platelets: 282 10*3/uL (ref 150–400)
RBC: 4.68 MIL/uL (ref 3.87–5.11)
RDW: 12.3 % (ref 11.5–15.5)
WBC: 11.6 10*3/uL — ABNORMAL HIGH (ref 4.0–10.5)
nRBC: 0 % (ref 0.0–0.2)

## 2023-07-14 MED ORDER — DOXYCYCLINE HYCLATE 100 MG PO CAPS: 100.0000 mg | ORAL_CAPSULE | Freq: Two times a day (BID) | ORAL | 0 refills | Status: DC

## 2023-07-14 MED ORDER — LIDOCAINE-EPINEPHRINE (PF) 2 %-1:200000 IJ SOLN
20.0000 mL | Freq: Once | INTRAMUSCULAR | Status: AC
  Administered 2023-07-14: 20 mL
  Filled 2023-07-14: qty 20

## 2023-07-14 MED ORDER — OXYCODONE-ACETAMINOPHEN 5-325 MG PO TABS: 1.0000 | ORAL_TABLET | Freq: Four times a day (QID) | ORAL | 0 refills | Status: AC | PRN

## 2023-07-14 MED ORDER — LIDOCAINE-EPINEPHRINE-TETRACAINE (LET) TOPICAL GEL
3.0000 mL | Freq: Once | TOPICAL | Status: AC
  Administered 2023-07-14: 3 mL via TOPICAL
  Filled 2023-07-14: qty 3

## 2023-07-14 MED ORDER — HYDROMORPHONE HCL 1 MG/ML IJ SOLN
1.0000 mg | Freq: Once | INTRAMUSCULAR | Status: AC
  Administered 2023-07-14: 1 mg via INTRAMUSCULAR
  Filled 2023-07-14: qty 1

## 2023-07-14 MED ORDER — DOXYCYCLINE HYCLATE 100 MG PO TABS
100.0000 mg | ORAL_TABLET | Freq: Once | ORAL | Status: AC
  Administered 2023-07-15: 100 mg via ORAL
  Filled 2023-07-14: qty 1

## 2023-07-14 NOTE — ED Provider Notes (Signed)
[facility]PT [facility] Emergency Department Provider Note MRN:  [id number]  Arrival date & time: 07/14/23     Chief Complaint   Abscess   History of Present Illness   Danielle Norman is a 23 y.o. year-old female presents to the ED with chief complaint of abscess.  She states that she has hx of HS.  States that she has had a recurrent right axillary abscess that has been worsening over the past 2 days.  She denies fevers, chills.  Reports pain with palpation and arm movement.  States that she is diabetic and thinks her sugar is high.  History provided by patient.   Review of Systems  Pertinent positive and negative review of systems noted in HPI.    Physical Exam   Vitals:   07/14/23 1600 07/14/23 2113  BP: 129/81 131/84  Pulse: (!) 102 96  Resp: 16 16  Temp: 99.8 F (37.7 C) 98.8 F (37.1 C)  SpO2: 100% 99%    CONSTITUTIONAL:  uncomfortable-appearing, NAD NEURO:  Alert and oriented x 3, CN 3-12 grossly intact EYES:  eyes equal and reactive ENT/NECK:  Supple, no stridor  CARDIO:  normal rate, regular rhythm, appears well-perfused  PULM:  No respiratory distress, CTAB GI/GU:  non-distended,  MSK/SPINE:  No gross deformities, no edema, moves all extremities  SKIN:  2x3 cm right axillary abscess   *Additional and/or pertinent findings included in MDM below  Diagnostic and Interventional Summary    EKG Interpretation Date/Time:    Ventricular Rate:    PR Interval:    QRS Duration:    QT Interval:    QTC Calculation:   R Axis:      Text Interpretation:         Labs Reviewed  CBC WITH DIFFERENTIAL/PLATELET - Abnormal; Notable for the following components:      Result Value   WBC 11.6 (*)    Neutro Abs 7.9 (*)    All other components within normal limits  BASIC METABOLIC PANEL - Abnormal; Notable for the following components:   Sodium 133 (*)    Potassium 3.3 (*)    Chloride 97 (*)    Glucose, Bld 360 (*)    All other components within  normal limits  BODY FLUID CULTURE W GRAM STAIN  GRAM STAIN    No orders to display    Medications  lidocaine-EPINEPHrine (XYLOCAINE W/EPI) 2 %-1:200000 (PF) injection 20 mL (has no administration in time range)  doxycycline (VIBRA-TABS) tablet 100 mg (has no administration in time range)  lidocaine-EPINEPHrine-tetracaine (LET) topical gel (3 mLs Topical Given 07/14/23 2247)  HYDROmorphone (DILAUDID) injection 1 mg (1 mg Intramuscular Given 07/14/23 2247)     Procedures  /  Critical Care .Incision and Drainage  Date/Time: 07/14/2023 11:50 PM  Performed by: Roxy Horseman, PA-C Authorized by: Roxy Horseman, PA-C   Consent:    Consent obtained:  Verbal   Consent given by:  Patient   Risks discussed:  Bleeding, incomplete drainage, pain and damage to other organs   Alternatives discussed:  No treatment Universal protocol:    Procedure explained and questions answered to patient or proxy's satisfaction: yes     Relevant documents present and verified: yes     Test results available : yes     Imaging studies available: yes     Required blood products, implants, devices, and special equipment available: yes     Site/side marked: yes     Immediately prior to procedure, a time out  was called: yes     Patient identity confirmed:  Verbally with patient Location:    Type:  Abscess Pre-procedure details:    Skin preparation:  Betadine Anesthesia:    Anesthesia method:  Local infiltration and topical application   Topical anesthetic:  LET   Local anesthetic:  Lidocaine 1% WITH epi Procedure type:    Complexity:  Complex Procedure details:    Needle aspiration: yes     Needle size:  18 G   Incision types:  Single straight   Incision depth:  Subcutaneous   Wound management:  Probed and deloculated, irrigated with saline and extensive cleaning   Drainage:  Purulent   Drainage amount:  Copious   Packing materials:  1/4 in gauze Post-procedure details:    Procedure completion:   Tolerated well, no immediate complications   ED Course and Medical Decision Making  I have reviewed the triage vital signs, the nursing notes, and pertinent available records from the EMR.  Social Determinants Affecting Complexity of Care: Patient has no clinically significant social determinants affecting this chief complaint..   ED Course:    Medical Decision Making Patient with large right axillary abscess.  Will need I&D.  She is concerned that there might be residual packing in the abscess, but states that the last time she had it drained, the packing fell out.  She is suspicious that there was 2 stings of packing put in.  She has had worsening pain for the past 2 days.  Will treat pain with IM dilaudid.  Will I&D at bedside.  Patient had significant relief after incision and drainage to the bedside.  Will start doxycycline.  Advised follow-up with general surgery.  Patient understands and agrees with the plan.  Amount and/or Complexity of Data Reviewed Labs: ordered.  Risk Prescription drug management.         Consultants: No consultations were needed in caring for this patient.   Treatment and Plan: Emergency department workup does not suggest an emergent condition requiring admission or immediate intervention beyond  what has been performed at this time. The patient is safe for discharge and has  been instructed to return immediately for worsening symptoms, change in  symptoms or any other concerns    Final Clinical Impressions(s) / ED Diagnoses     ICD-10-CM   1. Abscess  L02.91       ED Discharge Orders          Ordered    oxyCODONE-acetaminophen (PERCOCET) 5-325 MG tablet  Every 6 hours PRN        07/14/23 2348    doxycycline (VIBRAMYCIN) 100 MG capsule  2 times daily        07/14/23 2348              Discharge Instructions Discussed with and Provided to Patient:     Discharge Instructions      Please call the general surgery clinic  listed.  They can discuss potential options that might prevent recurrence.   You can pull out the packing material after 2 days.  Return for new or worsening symptoms.  Take antibiotics as directed.   Do not drive or operate heavy machinery while taking your pain medication.  It is like driving drunk.  Do not mix alcohol and pain medicine.  Take your medications as prescribed.        Roxy Horseman, PA-C 07/14/23 2351    Gilda Crease, MD 07/15/23 [phone]

## 2023-07-14 NOTE — Discharge Instructions (Addendum)
Please call the general surgery clinic listed.  They can discuss potential options that might prevent recurrence.   You can pull out the packing material after 2 days.  Return for new or worsening symptoms.  Take antibiotics as directed.   Do not drive or operate heavy machinery while taking your pain medication.  It is like driving drunk.  Do not mix alcohol and pain medicine.  Take your medications as prescribed.

## 2023-07-14 NOTE — ED Triage Notes (Signed)
The pts abscess under her rt arm was drained one month ago  she was told that the drain would come out but she feels like there is still part of the drain left  draining and she has had a temp   lmp 4 weeks ago

## 2023-07-14 NOTE — ED Provider Triage Note (Signed)
Emergency Medicine Provider Triage Evaluation Note  Danielle Norman , a 23 y.o. female  was evaluated in triage.  Pt complains of abscess.  Review of Systems  Positive:  Negative:   Physical Exam  BP 129/81 (BP Location: Left Arm)   Pulse (!) 102   Temp 99.8 F (37.7 C) (Oral)   Resp 16   SpO2 100%  Gen:   Awake, no distress   Resp:  Normal effort  MSK:   Moves extremities without difficulty  Other:    Medical Decision Making  Medically screening exam initiated at 4:04 PM.  Appropriate orders placed.  Danielle Norman was informed that the remainder of the evaluation will be completed by another provider, this initial triage assessment does not replace that evaluation, and the importance of remaining in the ED until their evaluation is complete.  Right axillary abscess drained last month. Patient stating that it feels like a piece of packing is stuck inside, but it was otherwise healing appropriately. Abscess started getting bigger 3 days ago. Patient also endorses fever near 103F at home. Denies any other infectious symptoms. Abscess appears to be approx 2-3cm.   Danielle Norman F, [place] 07/14/23 1606

## 2023-07-15 MED ORDER — OXYCODONE-ACETAMINOPHEN 5-325 MG PO TABS
2.0000 | ORAL_TABLET | Freq: Once | ORAL | Status: AC
  Administered 2023-07-15: 2 via ORAL
  Filled 2023-07-15: qty 2

## 2023-07-17 LAB — BODY FLUID CULTURE W GRAM STAIN

## 2023-07-18 ENCOUNTER — Telehealth (HOSPITAL_BASED_OUTPATIENT_CLINIC_OR_DEPARTMENT_OTHER): Payer: Self-pay | Admitting: *Deleted

## 2023-07-18 NOTE — Telephone Encounter (Signed)
Post ED Visit - Positive Culture Follow-up: Successful Patient Follow-Up  Culture assessed and recommendations reviewed by:  [x]  [name], Pharm.D. []  [name], [address].D., BCPS AQ-ID []  [name], Pharm.D., BCPS []  [name], Pharm.D., BCPS []  [place], [address].D., BCPS, AAHIVP []  [name], Pharm.D., BCPS, AAHIVP []  [name], PharmD, BCPS []  [name], PharmD, BCPS []  [name], PharmD, BCPS []  [name], PharmD  Positive body fluid culture  []  Patient discharged without antimicrobial prescription and treatment is now indicated [x]  Organism is resistant to prescribed ED discharge antimicrobial []  Patient with positive blood cultures  Changes discussed with ED provider: [name] New antibiotic prescription Augmentin 875/125mg  twice daily x 10 days Called to [facility] [address] [place], [place] 07/18/23 1319  Contacted patient, date 07/18/23, time 1319   Danielle Norman 07/18/2023, 1:19 PM

## 2023-07-18 NOTE — Progress Notes (Signed)
ED Antimicrobial Stewardship Positive Culture Follow Up   Danielle Norman is an 23 y.o. female who presented to [facility] on @ADMITDT @ with a chief complaint of  Chief Complaint  Patient presents with   Abscess    Recent Results (from the past 720 hours)  Body fluid culture w Gram Stain     Status: None   Collection Time: 07/14/23 11:47 PM   Specimen: Abscess; Body Fluid  Result Value Ref Range Status   Specimen Description ABSCESS  Final   Special Requests NONE  Final   Gram Stain   Final    ABUNDANT WBC PRESENT,BOTH PMN AND MONONUCLEAR MODERATE GRAM POSITIVE COCCI IN PAIRS Performed at [facility], [address], [place], [place] [address]    Culture ABUNDANT ENTEROCOCCUS FAECALIS  Final   Report Status 07/17/2023 FINAL  Final   Organism ID, Bacteria ENTEROCOCCUS FAECALIS  Final      Susceptibility   Enterococcus faecalis - MIC*    AMPICILLIN <=2 SENSITIVE Sensitive     VANCOMYCIN 1 SENSITIVE Sensitive     GENTAMICIN SYNERGY SENSITIVE Sensitive     * ABUNDANT ENTEROCOCCUS FAECALIS    [x]  Treated with doxycycline, organism resistant to prescribed antimicrobial  STOP doxycycline START: Augmentin 875/125mg  tablets take 1 tablet twice daily for 10 days (Qty 20; Refills 0)  ED Provider: Carles Collet, PharmD, BCPS 07/18/2023 11:11 AM ED Clinical Pharmacist -  [phone]

## 2023-10-13 ENCOUNTER — Encounter (HOSPITAL_COMMUNITY): Payer: Self-pay | Admitting: *Deleted

## 2023-10-13 ENCOUNTER — Emergency Department (HOSPITAL_COMMUNITY)
Admission: EM | Admit: 2023-10-13 | Discharge: 2023-10-14 | Disposition: A | Discharge status: Home / Self Care | Attending: Emergency Medicine | Admitting: Emergency Medicine

## 2023-10-13 ENCOUNTER — Other Ambulatory Visit: Payer: Self-pay

## 2023-10-13 DIAGNOSIS — L0231 Cutaneous abscess of buttock: Secondary | ICD-10-CM | POA: Insufficient documentation

## 2023-10-13 DIAGNOSIS — Z794 Long term (current) use of insulin: Secondary | ICD-10-CM | POA: Diagnosis not present

## 2023-10-13 DIAGNOSIS — Z7984 Long term (current) use of oral hypoglycemic drugs: Secondary | ICD-10-CM | POA: Insufficient documentation

## 2023-10-13 DIAGNOSIS — E119 Type 2 diabetes mellitus without complications: Secondary | ICD-10-CM | POA: Diagnosis not present

## 2023-10-13 NOTE — ED Triage Notes (Signed)
 The pt has  an abscess on her rt buttocks that she has had for 3-4 days  it has been draining but is getting larger  lmp  now  no temp

## 2023-10-14 MED ORDER — OXYCODONE-ACETAMINOPHEN 5-325 MG PO TABS
1.0000 | ORAL_TABLET | Freq: Once | ORAL | Status: AC
  Administered 2023-10-14: 1 via ORAL
  Filled 2023-10-14: qty 1

## 2023-10-14 MED ORDER — LIDOCAINE-EPINEPHRINE (PF) 2 %-1:200000 IJ SOLN
10.0000 mL | Freq: Once | INTRAMUSCULAR | Status: AC
  Administered 2023-10-14: 10 mL via INTRADERMAL
  Filled 2023-10-14: qty 20

## 2023-10-14 MED ORDER — DOXYCYCLINE HYCLATE 100 MG PO CAPS: 100.0000 mg | ORAL_CAPSULE | Freq: Two times a day (BID) | ORAL | 0 refills | Status: DC

## 2023-10-14 MED ORDER — HYDROCODONE-ACETAMINOPHEN 5-325 MG PO TABS: 1.0000 | ORAL_TABLET | Freq: Four times a day (QID) | ORAL | 0 refills | Status: DC | PRN

## 2023-10-14 MED ORDER — LIDOCAINE HCL 2 % IJ SOLN
10.0000 mL | Freq: Once | INTRAMUSCULAR | Status: DC
  Filled 2023-10-14: qty 20

## 2023-10-14 MED ORDER — ONDANSETRON 4 MG PO TBDP
8.0000 mg | ORAL_TABLET | Freq: Once | ORAL | Status: AC
  Administered 2023-10-14: 8 mg via ORAL
  Filled 2023-10-14: qty 2

## 2023-10-14 NOTE — Discharge Instructions (Signed)
 You were seen today for an abscess which was drained at bedside.  You may remove the packing material in 1 to 2 days.  If it falls out prior to that this is okay.  Continue to use warm compresses in the shower.  I have prescribed antibiotics.  Please take the entire course.  I also prescribed a short course of pain medication to be taken as directed.  Follow-up with your primary care provider as needed.  I have provided contact information for the hidradenitis clinic.  You may call them to try to schedule an appointment for follow-up as well.

## 2023-10-14 NOTE — ED Provider Notes (Signed)
 [facility] EMERGENCY DEPARTMENT AT [facility] Provider Note   CSN: [id number] Arrival date & time: 10/13/23  1951     History  Chief Complaint  Patient presents with   Abscess    Danielle Norman is a 23 y.o. female.  Patient with reported history of hidradenitis, type II DM, obesity presents to the emergency department complaining of a gluteal abscess.  Patient states that she has had an abscess on her right buttocks for 3 to 4 days.  She states she was able to pop it a few days ago with some drainage but that it sealed back up and is now more swollen and painful.  She denies nausea, vomiting, systemic symptoms at this time.   Abscess      Home Medications Prior to Admission medications   Medication Sig Start Date End Date Taking? Authorizing Provider  doxycycline  (VIBRAMYCIN ) 100 MG capsule Take 1 capsule (100 mg total) by mouth 2 (two) times daily. 10/14/23  Yes Elisa Guest, PA-C  HYDROcodone -acetaminophen  (NORCO/VICODIN) 5-325 MG tablet Take 1 tablet by mouth every 6 (six) hours as needed. 10/14/23  Yes Abelardo Hoehn B, PA-C  Accu-Chek FastClix Lancets MISC Check sugar 6 x daily 10/21/18   Candee Cha, NP  acetone, urine, test strip Check ketones per protocol Patient not taking: Reported on 07/09/2022 11/01/14   Marrian Siva, MD  blood glucose meter kit and supplies Dispense based on patient and insurance preference. Use up to four times daily as directed. (FOR ICD-10 E10.9, E11.9). 10/20/21   Coretta Dexter, PA  Blood Glucose Monitoring Suppl (ACCU-CHEK GUIDE ME) w/Device KIT 1 Device by Does not apply route 3 (three) times daily. 07/09/22   Shamleffer, Ibtehal Jaralla, MD  dicyclomine  (BENTYL ) 20 MG tablet Take 1 tablet (20 mg total) by mouth 2 (two) times daily. Patient not taking: Reported on 05/21/2023 09/20/22   Spence Dux, PA-C  docusate sodium  (COLACE) 100 MG capsule Take 1 capsule (100 mg total) by mouth 2 (two) times daily. Patient not taking:  Reported on 07/09/2022 12/27/21   Daren Eck, DO  Dulaglutide  (TRULICITY ) 0.75 MG/0.5ML SOPN Inject 0.75 mg into the skin once a week. Patient not taking: Reported on 05/21/2023 07/09/22   Shamleffer, Ibtehal Jaralla, MD  emtricitabine -tenofovir  (TRUVADA ) 200-300 MG tablet Take 1 tablet by mouth daily. Patient not taking: Reported on 05/21/2023 12/25/21   Daren Eck, DO  glucagon  1 MG injection Inject 1 mg into the muscle once as needed for up to 1 dose. Use for Severe Hypoglycemia . Inject 1 mg intramuscularly if unresponsive, unable to swallow, unconscious and/or has seizure 07/09/22   Shamleffer, Julian Obey, MD  glucose blood (ACCU-CHEK GUIDE) test strip 1 each by Other route 3 (three) times daily. Use as instructed 07/09/22   Shamleffer, Ibtehal Jaralla, MD  insulin  aspart (NOVOLOG  FLEXPEN) 100 UNIT/ML FlexPen Max daily 30 units Patient taking differently: 30-60 Units See admin instructions. Per blood glucose reading. Max 60 units 07/09/22   Shamleffer, Ibtehal Jaralla, MD  Insulin  Degludec (TRESIBA ) 100 UNIT/ML SOLN Inject 48 Units into the skin daily.    [provider]  insulin  glargine (LANTUS  SOLOSTAR) 100 UNIT/ML Solostar Pen Inject 28 Units into the skin daily. Patient not taking: Reported on 05/21/2023 07/13/22   Shamleffer, Ibtehal Jaralla, MD  Insulin  Pen Needle (BD PEN NEEDLE NANO U/F) 32G X 4 MM MISC BD PEN NEEDLES- BRAND SPECIFIC. INJECT INSULIN  VIA INSULIN  PEN 6 X DAILY 10/20/21   Coretta Dexter, PA  Insulin  Pen Needle (PEN NEEDLES 3/16") 31G X 5 MM MISC 1 each by Does not apply route in the morning, at noon, in the evening, and at bedtime. 07/09/22   Shamleffer, Ibtehal Jaralla, MD  metFORMIN  (GLUCOPHAGE ) 1000 MG tablet Take 1 tablet (1,000 mg total) by mouth 2 (two) times daily. 07/09/22   Shamleffer, Ibtehal Jaralla, MD  metroNIDAZOLE  (FLAGYL ) 500 MG tablet Take 1 tablet (500 mg total) by mouth 2 (two) times daily. 05/21/23   Denese Finn, PA-C  naproxen sodium  (ALEVE) 220 MG tablet Take 440 mg by mouth 2 (two) times daily as needed (pain). Patient not taking: Reported on 07/09/2022    [provider]  ondansetron  (ZOFRAN ) 4 MG tablet Take 1 tablet (4 mg total) by mouth every 6 (six) hours. Patient not taking: Reported on 05/21/2023 09/20/22   Spence Dux, PA-C  oxyCODONE -acetaminophen  (PERCOCET) 5-325 MG tablet Take 1-2 tablets by mouth every 6 (six) hours as needed. 07/14/23   Sherel Dikes, PA-C  senna-docusate (SENOKOT-S) 8.6-50 MG tablet Take 1 tablet by mouth at bedtime as needed for mild constipation. Patient not taking: Reported on 05/21/2023 08/28/22   Long, Shereen Dike, MD      Allergies    Patient has no known allergies.    Review of Systems   Review of Systems  Physical Exam Updated Vital Signs BP 117/62   Pulse (!) 112   Temp 99.8 F (37.7 C) (Oral)   Resp 18   Ht 5\' 11"  (1.803 m)   Wt 96.2 kg   LMP 10/13/2023   SpO2 100%   BMI 29.58 kg/m  Physical Exam Vitals and nursing note reviewed. Exam conducted with a chaperone present.  Constitutional:      General: She is not in acute distress.    Appearance: She is well-developed.  HENT:     Head: Normocephalic and atraumatic.  Eyes:     Conjunctiva/sclera: Conjunctivae normal.  Cardiovascular:     Rate and Rhythm: Normal rate and regular rhythm.     Heart sounds: No murmur heard. Pulmonary:     Effort: Pulmonary effort is normal. No respiratory distress.     Breath sounds: Normal breath sounds.  Musculoskeletal:        General: No swelling.     Cervical back: Neck supple.  Skin:    General: Skin is warm and dry.     Capillary Refill: Capillary refill takes less than 2 seconds.     Comments: Indurated fluctuant area on the right buttock  Neurological:     Mental Status: She is alert.  Psychiatric:        Mood and Affect: Mood normal.     ED Results / Procedures / Treatments   Labs (all labs ordered are listed, but only abnormal results are  displayed) Labs Reviewed - No data to display  EKG None  Radiology No results found.  Procedures .Incision and Drainage  Date/Time: 10/14/2023 1:02 AM  Performed by: Elisa Guest, PA-C Authorized by: Elisa Guest, PA-C   Consent:    Consent obtained:  Verbal   Consent given by:  Patient   Risks, benefits, and alternatives were discussed: yes     Risks discussed:  Bleeding, incomplete drainage, pain and infection   Alternatives discussed:  Alternative treatment Universal protocol:    Patient identity confirmed:  Verbally with patient Location:    Type:  Abscess   Size:  3cm x 4cm x 3cm   Location:  Lower extremity  Lower extremity location:  Buttock   Buttock location:  R buttock Pre-procedure details:    Skin preparation:  Povidone-iodine  Sedation:    Sedation type:  None Anesthesia:    Anesthesia method:  Local infiltration   Local anesthetic:  Lidocaine  2% WITH epi Procedure type:    Complexity:  Complex Procedure details:    Ultrasound guidance: yes     Incision types:  Single straight   Wound management:  Probed and deloculated, irrigated with saline and extensive cleaning   Drainage:  Purulent   Drainage amount:  Copious   Wound treatment:  Wound left open   Packing materials:  1/4 in gauze Post-procedure details:    Procedure completion:  Tolerated well, no immediate complications .Ultrasound ED Soft Tissue  Date/Time: 10/14/2023 1:03 AM  Performed by: Elisa Guest, PA-C Authorized by: Elisa Guest, PA-C   Procedure details:    Indications: localization of abscess     Transverse view:  Visualized   Longitudinal view:  Visualized   Images: not archived   Location:    Location: buttocks     Side:  Right Findings:     abscess present     Medications Ordered in ED Medications  oxyCODONE -acetaminophen  (PERCOCET/ROXICET) 5-325 MG per tablet 1 tablet (1 tablet Oral Given 10/14/23 0019)  ondansetron  (ZOFRAN -ODT) disintegrating tablet  8 mg (8 mg Oral Given 10/14/23 0040)  lidocaine -EPINEPHrine  (XYLOCAINE  W/EPI) 2 %-1:200000 (PF) injection 10 mL (10 mLs Intradermal Given 10/14/23 0041)    ED Course/ Medical Decision Making/ A&P                                 Medical Decision Making Risk Prescription drug management.   This patient presents to the ED for concern of possible abscess, this involves an extensive number of treatment options, and is a complaint that carries with it a high risk of complications and morbidity.  The differential diagnosis includes abscess, cellulitis, others   Co morbidities that complicate the patient evaluation  HA, type II DM    Problem List / ED Course / Critical interventions / Medication management   I ordered medication including Percocet for pain, Zofran  for nausea Reevaluation of the patient after these medicines showed that the patient improved I have reviewed the patients home medicines and have made adjustments as needed   Social Determinants of Health:  Patient has Medicaid for her primary health insurance type   Test / Admission - Considered:  Patient with abscess of the right buttock.  This was drained at bedside with an I&D.  The wound was packed with iodoform.  Patient with history of the same.  Will refer to hidradenitis clinic.  Patient was prescribed doxycycline  and a short course of pain medication.  She tolerated the procedure well.  There was a copious discharge.  Patient to remove packing in 1 to 2 days.  She will follow-up as needed with primary care provider.         Final Clinical Impression(s) / ED Diagnoses Final diagnoses:  Abscess of buttock, right    Rx / DC Orders ED Discharge Orders          Ordered    doxycycline  (VIBRAMYCIN ) 100 MG capsule  2 times daily        10/14/23 0106    HYDROcodone -acetaminophen  (NORCO/VICODIN) 5-325 MG tablet  Every 6 hours PRN        10/14/23 0106  Delories Fetter 10/14/23  0113    Alissa April, MD 10/14/23 [phone]

## 2023-11-11 ENCOUNTER — Emergency Department (HOSPITAL_COMMUNITY)
Admission: EM | Admit: 2023-11-11 | Discharge: 2023-11-11 | Disposition: A | Discharge status: Home / Self Care | Attending: Emergency Medicine | Admitting: Emergency Medicine

## 2023-11-11 ENCOUNTER — Other Ambulatory Visit: Payer: Self-pay

## 2023-11-11 ENCOUNTER — Encounter (HOSPITAL_COMMUNITY): Payer: Self-pay | Admitting: *Deleted

## 2023-11-11 DIAGNOSIS — Z794 Long term (current) use of insulin: Secondary | ICD-10-CM | POA: Insufficient documentation

## 2023-11-11 DIAGNOSIS — L02412 Cutaneous abscess of left axilla: Secondary | ICD-10-CM | POA: Diagnosis present

## 2023-11-11 DIAGNOSIS — E119 Type 2 diabetes mellitus without complications: Secondary | ICD-10-CM | POA: Insufficient documentation

## 2023-11-11 DIAGNOSIS — L02411 Cutaneous abscess of right axilla: Secondary | ICD-10-CM | POA: Insufficient documentation

## 2023-11-11 DIAGNOSIS — Z7984 Long term (current) use of oral hypoglycemic drugs: Secondary | ICD-10-CM | POA: Insufficient documentation

## 2023-11-11 LAB — CBC
HCT: 39.2 % (ref 36.0–46.0)
Hemoglobin: 12.8 g/dL (ref 12.0–15.0)
MCH: 26.9 pg (ref 26.0–34.0)
MCHC: 32.7 g/dL (ref 30.0–36.0)
MCV: 82.4 fL (ref 80.0–100.0)
Platelets: 302 10*3/uL (ref 150–400)
RBC: 4.76 MIL/uL (ref 3.87–5.11)
RDW: 13 % (ref 11.5–15.5)
WBC: 11.4 10*3/uL — ABNORMAL HIGH (ref 4.0–10.5)
nRBC: 0 % (ref 0.0–0.2)

## 2023-11-11 LAB — COMPREHENSIVE METABOLIC PANEL WITH GFR
ALT: 14 U/L (ref 0–44)
AST: 16 U/L (ref 15–41)
Albumin: 3.5 g/dL (ref 3.5–5.0)
Alkaline Phosphatase: 100 U/L (ref 38–126)
Anion gap: 10 (ref 5–15)
BUN: 10 mg/dL (ref 6–20)
CO2: 23 mmol/L (ref 22–32)
Calcium: 9.3 mg/dL (ref 8.9–10.3)
Chloride: 101 mmol/L (ref 98–111)
Creatinine, Ser: 0.6 mg/dL (ref 0.44–1.00)
GFR, Estimated: >60 mL/min (ref >60)
Glucose, Bld: 352 mg/dL — ABNORMAL HIGH (ref 70–99)
Potassium: 3.6 mmol/L (ref 3.5–5.1)
Sodium: 134 mmol/L — ABNORMAL LOW (ref 135–145)
Total Bilirubin: 0.4 mg/dL (ref 0.0–1.2)
Total Protein: 7.6 g/dL (ref 6.5–8.1)

## 2023-11-11 MED ORDER — DOXYCYCLINE HYCLATE 100 MG PO TABS
100.0000 mg | ORAL_TABLET | Freq: Once | ORAL | Status: AC
  Administered 2023-11-11: 100 mg via ORAL
  Filled 2023-11-11: qty 1

## 2023-11-11 MED ORDER — FENTANYL CITRATE PF 50 MCG/ML IJ SOSY
50.0000 ug | PREFILLED_SYRINGE | Freq: Once | INTRAMUSCULAR | Status: AC
  Administered 2023-11-11: 50 ug via INTRAMUSCULAR
  Filled 2023-11-11: qty 1

## 2023-11-11 MED ORDER — CEPHALEXIN 250 MG PO CAPS
500.0000 mg | ORAL_CAPSULE | Freq: Once | ORAL | Status: AC
  Administered 2023-11-11: 500 mg via ORAL
  Filled 2023-11-11: qty 2

## 2023-11-11 MED ORDER — CEPHALEXIN 500 MG PO CAPS: 500.0000 mg | ORAL_CAPSULE | Freq: Three times a day (TID) | ORAL | 0 refills | Status: AC

## 2023-11-11 MED ORDER — LIDOCAINE-EPINEPHRINE (PF) 2 %-1:200000 IJ SOLN
10.0000 mL | Freq: Once | INTRAMUSCULAR | Status: AC
  Administered 2023-11-11: 10 mL
  Filled 2023-11-11: qty 20

## 2023-11-11 MED ORDER — DOXYCYCLINE HYCLATE 100 MG PO CAPS: 100.0000 mg | ORAL_CAPSULE | Freq: Two times a day (BID) | ORAL | 0 refills | Status: DC

## 2023-11-11 NOTE — ED Provider Notes (Signed)
 [place] EMERGENCY DEPARTMENT AT [facility] Provider Note   CSN: [id number] Arrival date & time: 11/11/23  0106     History  Chief Complaint  Patient presents with   Abscess    Danielle Norman is a 23 y.o. female with history of abscesses, diabetes.  The patient presents to the ED for evaluation of bilateral axilla abscesses.  She reports over the last 2 days she has noticed enlarging areas consistent with abscess to her bilateral armpits.  She denies any fevers at home, nausea or vomiting.  Denies any drainage from these areas.  Reports history of abscesses.   Abscess      Home Medications Prior to Admission medications   Medication Sig Start Date End Date Taking? Authorizing Provider  cephALEXin (KEFLEX) 500 MG capsule Take 1 capsule (500 mg total) by mouth 3 (three) times daily. 11/11/23  Yes Adel Aden, PA-C  doxycycline  (VIBRAMYCIN ) 100 MG capsule Take 1 capsule (100 mg total) by mouth 2 (two) times daily. 11/11/23  Yes Adel Aden, PA-C  Accu-Chek FastClix Lancets MISC Check sugar 6 x daily 10/21/18   Candee Cha, NP  acetone, urine, test strip Check ketones per protocol Patient not taking: Reported on 07/09/2022 11/01/14   Marrian Siva, MD  blood glucose meter kit and supplies Dispense based on patient and insurance preference. Use up to four times daily as directed. (FOR ICD-10 E10.9, E11.9). 10/20/21   Coretta Dexter, PA  Blood Glucose Monitoring Suppl (ACCU-CHEK GUIDE ME) w/Device KIT 1 Device by Does not apply route 3 (three) times daily. 07/09/22   Shamleffer, Ibtehal Jaralla, MD  dicyclomine  (BENTYL ) 20 MG tablet Take 1 tablet (20 mg total) by mouth 2 (two) times daily. Patient not taking: Reported on 05/21/2023 09/20/22   Spence Dux, PA-C  docusate sodium  (COLACE) 100 MG capsule Take 1 capsule (100 mg total) by mouth 2 (two) times daily. Patient not taking: Reported on 07/09/2022 12/27/21   Daren Eck, DO  Dulaglutide   (TRULICITY ) 0.75 MG/0.5ML SOPN Inject 0.75 mg into the skin once a week. Patient not taking: Reported on 05/21/2023 07/09/22   Shamleffer, Ibtehal Jaralla, MD  emtricitabine -tenofovir  (TRUVADA ) 200-300 MG tablet Take 1 tablet by mouth daily. Patient not taking: Reported on 05/21/2023 12/25/21   Daren Eck, DO  glucagon  1 MG injection Inject 1 mg into the muscle once as needed for up to 1 dose. Use for Severe Hypoglycemia . Inject 1 mg intramuscularly if unresponsive, unable to swallow, unconscious and/or has seizure 07/09/22   Shamleffer, Julian Obey, MD  glucose blood (ACCU-CHEK GUIDE) test strip 1 each by Other route 3 (three) times daily. Use as instructed 07/09/22   Shamleffer, Ibtehal Jaralla, MD  HYDROcodone -acetaminophen  (NORCO/VICODIN) 5-325 MG tablet Take 1 tablet by mouth every 6 (six) hours as needed. 10/14/23   Elisa Guest, PA-C  insulin  aspart (NOVOLOG  FLEXPEN) 100 UNIT/ML FlexPen Max daily 30 units Patient taking differently: 30-60 Units See admin instructions. Per blood glucose reading. Max 60 units 07/09/22   Shamleffer, Julian Obey, MD  Insulin  Degludec (TRESIBA ) 100 UNIT/ML SOLN Inject 48 Units into the skin daily.    [provider]  insulin  glargine (LANTUS  SOLOSTAR) 100 UNIT/ML Solostar Pen Inject 28 Units into the skin daily. Patient not taking: Reported on 05/21/2023 07/13/22   Shamleffer, Ibtehal Jaralla, MD  Insulin  Pen Needle (BD PEN NEEDLE NANO U/F) 32G X 4 MM MISC BD PEN NEEDLES- BRAND SPECIFIC. INJECT INSULIN  VIA INSULIN  PEN 6 X DAILY 10/20/21  Ofelia Bent S, PA  Insulin  Pen Needle (PEN NEEDLES 3/16") 31G X 5 MM MISC 1 each by Does not apply route in the morning, at noon, in the evening, and at bedtime. 07/09/22   Shamleffer, Ibtehal Jaralla, MD  metFORMIN  (GLUCOPHAGE ) 1000 MG tablet Take 1 tablet (1,000 mg total) by mouth 2 (two) times daily. 07/09/22   Shamleffer, Ibtehal Jaralla, MD  metroNIDAZOLE  (FLAGYL ) 500 MG tablet Take 1 tablet (500 mg total) by  mouth 2 (two) times daily. 05/21/23   Denese Finn, PA-C  naproxen sodium (ALEVE) 220 MG tablet Take 440 mg by mouth 2 (two) times daily as needed (pain). Patient not taking: Reported on 07/09/2022    [provider]  ondansetron  (ZOFRAN ) 4 MG tablet Take 1 tablet (4 mg total) by mouth every 6 (six) hours. Patient not taking: Reported on 05/21/2023 09/20/22   Spence Dux, PA-C  oxyCODONE -acetaminophen  (PERCOCET) 5-325 MG tablet Take 1-2 tablets by mouth every 6 (six) hours as needed. 07/14/23   Sherel Dikes, PA-C  senna-docusate (SENOKOT-S) 8.6-50 MG tablet Take 1 tablet by mouth at bedtime as needed for mild constipation. Patient not taking: Reported on 05/21/2023 08/28/22   Long, Shereen Dike, MD      Allergies    Patient has no known allergies.    Review of Systems   Review of Systems  Skin:        Abscess  All other systems reviewed and are negative.   Physical Exam Updated Vital Signs BP (!) 146/78 (BP Location: Right Arm)   Pulse (!) 115   Temp 98 F (36.7 C)   Resp 16   Ht 5\' 11"  (1.803 m)   Wt 96.2 kg   LMP 10/13/2023   SpO2 100%   BMI 29.58 kg/m  Physical Exam Vitals and nursing note reviewed.  Constitutional:      General: She is not in acute distress.    Appearance: She is well-developed.  HENT:     Head: Normocephalic and atraumatic.  Eyes:     Conjunctiva/sclera: Conjunctivae normal.  Cardiovascular:     Rate and Rhythm: Normal rate and regular rhythm.     Heart sounds: No murmur heard. Pulmonary:     Effort: Pulmonary effort is normal. No respiratory distress.     Breath sounds: Normal breath sounds.  Abdominal:     Palpations: Abdomen is soft.     Tenderness: There is no abdominal tenderness.  Musculoskeletal:        General: No swelling.     Cervical back: Neck supple.  Skin:    General: Skin is warm and dry.     Capillary Refill: Capillary refill takes less than 2 seconds.     Comments: 2 x 2 cm abscess with induration, minimal  fluctuance to right axilla.  Patient also with a 2 x 2 cm abscess to the left axilla with induration, no fluctuance.  No drainage from bilateral abscess site.  No erythema to bilateral abscess site.  Neurological:     Mental Status: She is alert and oriented to person, place, and time. Mental status is at baseline.  Psychiatric:        Mood and Affect: Mood normal.     ED Results / Procedures / Treatments   Labs (all labs ordered are listed, but only abnormal results are displayed) Labs Reviewed  CBC - Abnormal; Notable for the following components:      Result Value   WBC 11.4 (*)    All  other components within normal limits  COMPREHENSIVE METABOLIC PANEL WITH GFR - Abnormal; Notable for the following components:   Sodium 134 (*)    Glucose, Bld 352 (*)    All other components within normal limits    EKG None  Radiology No results found.  Procedures .Incision and Drainage  Date/Time: 11/11/2023 6:05 AM  Performed by: Adel Aden, PA-C Authorized by: Adel Aden, PA-C   Consent:    Consent obtained:  Verbal   Consent given by:  Patient   Risks, benefits, and alternatives were discussed: yes     Risks discussed:  Bleeding, damage to other organs, infection, incomplete drainage and pain   Alternatives discussed:  No treatment Universal protocol:    Patient identity confirmed:  Arm band Location:    Type:  Abscess   Size:  2x2   Location: Right axilla. Pre-procedure details:    Skin preparation:  Povidone-iodine  Sedation:    Sedation type:  None Anesthesia:    Anesthesia method:  Local infiltration   Local anesthetic:  Lidocaine  2% WITH epi Procedure type:    Complexity:  Simple Procedure details:    Incision types:  Stab incision   Wound management:  Probed and deloculated   Drainage:  Bloody   Drainage amount:  Scant   Packing materials:  None Post-procedure details:    Procedure completion:  Tolerated well, no immediate complications      Medications Ordered in ED Medications  fentaNYL  (SUBLIMAZE ) injection 50 mcg (50 mcg Intramuscular Given 11/11/23 0554)  lidocaine -EPINEPHrine  (XYLOCAINE  W/EPI) 2 %-1:200000 (PF) injection 10 mL (10 mLs Other Given by Other 11/11/23 0556)  cephALEXin (KEFLEX) capsule 500 mg (500 mg Oral Given 11/11/23 0555)  doxycycline  (VIBRA -TABS) tablet 100 mg (100 mg Oral Given 11/11/23 0555)    ED Course/ Medical Decision Making/ A&P  Medical Decision Making Amount and/or Complexity of Data Reviewed Labs: ordered.  Risk Prescription drug management.   23 year old female presents for evaluation.  Please see HPI for further details.  On exam patient does have a 2 x 2 cm area of induration with minimal fluctuance to the right axilla.  No erythema or drainage noted.  Patient also with a 2 x 2 cm area of induration without fluctuance to the left axilla.  No drainage from either axilla.  No erythema to either site.  Ultrasound imaging was utilized.  Ultrasound imaging reveals small amount of fluid in right abscess of right axilla.  No fluid collection noted in left axilla abscess site.  Patient did have her right axillary abscess drained per the procedure note.  She had a scant amount of blood expressed.  She was provided with 50 mcg of fentanyl  for pain control prior to this procedure being completed.  At this time, will place patient on doxycycline  and Keflex.  Will have her follow-up with her PCP.  She was given return precautions and she voiced understanding.  Stable to discharge home.   Final Clinical Impression(s) / ED Diagnoses Final diagnoses:  Abscess of left axilla  Abscess of axilla, right    Rx / DC Orders ED Discharge Orders          Ordered    cephALEXin (KEFLEX) 500 MG capsule  3 times daily        11/11/23 0608    doxycycline  (VIBRAMYCIN ) 100 MG capsule  2 times daily        11/11/23 0608  Adel Aden, PA-C 11/11/23 1610    Eldon Greenland,  MD 11/11/23 [id number]

## 2023-11-11 NOTE — ED Triage Notes (Signed)
 The pt has one  abscess under each arm for 2 days not sure about a temp  lmp  a few days ago

## 2023-11-11 NOTE — Discharge Instructions (Signed)
 It was a pleasure taking part in your care.  As discussed, please begin taking Keflex and doxycycline .  These are antibiotics.  You will take Keflex 3 times a day for 7 days.  You will take doxycycline  twice a day for 7 days.  Please follow-up with your PCP.  If your abscesses do not resolve or worsen, please return to the ED.

## 2023-11-11 NOTE — ED Notes (Signed)
 Pt asked if it was possible whenever she gets put into a room, to not allow the person that is with her to come back with her.

## 2023-11-14 ENCOUNTER — Other Ambulatory Visit: Payer: Self-pay

## 2023-11-14 ENCOUNTER — Emergency Department (HOSPITAL_COMMUNITY): Admission: EM | Admit: 2023-11-14 | Discharge: 2023-11-14 | Disposition: A | Discharge status: Home / Self Care

## 2023-11-14 ENCOUNTER — Encounter (HOSPITAL_COMMUNITY): Payer: Self-pay

## 2023-11-14 DIAGNOSIS — L02411 Cutaneous abscess of right axilla: Secondary | ICD-10-CM | POA: Diagnosis present

## 2023-11-14 MED ORDER — OXYCODONE-ACETAMINOPHEN 5-325 MG PO TABS
1.0000 | ORAL_TABLET | Freq: Once | ORAL | Status: AC
  Administered 2023-11-14: 1 via ORAL
  Filled 2023-11-14: qty 1

## 2023-11-14 MED ORDER — HYDROCODONE-ACETAMINOPHEN 5-325 MG PO TABS: 1.0000 | ORAL_TABLET | ORAL | 0 refills | Status: DC | PRN

## 2023-11-14 MED ORDER — LIDOCAINE HCL (PF) 1 % IJ SOLN
10.0000 mL | Freq: Once | INTRAMUSCULAR | Status: AC
  Administered 2023-11-14: 10 mL
  Filled 2023-11-14: qty 10

## 2023-11-14 NOTE — ED Provider Notes (Signed)
 [place] EMERGENCY DEPARTMENT AT [facility] Provider Note   CSN: [id number] Arrival date & time: 11/14/23  1035     History  Chief Complaint  Patient presents with   Abscess    Danielle Norman is a 23 y.o. female.  Patient complains of an abscess to right axilla.  Patient reports that she has had multiple abscesses in the past.  Patient reports that these have required I&D's multiple times in the past.  Patient had a I&D on 529 but reports that area did not drain.  Patient has been taking Keflex  and doxycycline .  She reports increased swelling.  The history is provided by the patient. No language interpreter was used.  Abscess Location:  Shoulder/arm Shoulder/arm abscess location:  R axilla Size:  10 cm Abscess quality: painful and warmth   Duration:  1 week Progression:  Worsening Pain details:    Severity:  Severe   Timing:  Constant   Progression:  Worsening Chronicity:  New Ineffective treatments:  Draining/squeezing Risk factors: prior abscess        Home Medications Prior to Admission medications   Medication Sig Start Date End Date Taking? Authorizing Provider  HYDROcodone -acetaminophen  (NORCO/VICODIN) 5-325 MG tablet Take 1 tablet by mouth every 4 (four) hours as needed. 11/14/23 11/13/24 Yes Dorothey Gate K, PA-C  Accu-Chek FastClix Lancets MISC Check sugar 6 x daily 10/21/18   Candee Cha, NP  acetone, urine, test strip Check ketones per protocol Patient not taking: Reported on 07/09/2022 11/01/14   Marrian Siva, MD  blood glucose meter kit and supplies Dispense based on patient and insurance preference. Use up to four times daily as directed. (FOR ICD-10 E10.9, E11.9). 10/20/21   Coretta Dexter, PA  Blood Glucose Monitoring Suppl (ACCU-CHEK GUIDE ME) w/Device KIT 1 Device by Does not apply route 3 (three) times daily. 07/09/22   Shamleffer, Ibtehal Jaralla, MD  cephALEXin  (KEFLEX ) 500 MG capsule Take 1 capsule (500 mg total) by mouth 3  (three) times daily. 11/11/23   Adel Aden, PA-C  dicyclomine  (BENTYL ) 20 MG tablet Take 1 tablet (20 mg total) by mouth 2 (two) times daily. Patient not taking: Reported on 05/21/2023 09/20/22   Spence Dux, PA-C  docusate sodium  (COLACE) 100 MG capsule Take 1 capsule (100 mg total) by mouth 2 (two) times daily. Patient not taking: Reported on 07/09/2022 12/27/21   Daren Eck, DO  doxycycline  (VIBRAMYCIN ) 100 MG capsule Take 1 capsule (100 mg total) by mouth 2 (two) times daily. 11/11/23   Adel Aden, PA-C  Dulaglutide  (TRULICITY ) 0.75 MG/0.5ML SOPN Inject 0.75 mg into the skin once a week. Patient not taking: Reported on 05/21/2023 07/09/22   Shamleffer, Ibtehal Jaralla, MD  emtricitabine -tenofovir  (TRUVADA ) 200-300 MG tablet Take 1 tablet by mouth daily. Patient not taking: Reported on 05/21/2023 12/25/21   Daren Eck, DO  glucagon  1 MG injection Inject 1 mg into the muscle once as needed for up to 1 dose. Use for Severe Hypoglycemia . Inject 1 mg intramuscularly if unresponsive, unable to swallow, unconscious and/or has seizure 07/09/22   Shamleffer, Julian Obey, MD  glucose blood (ACCU-CHEK GUIDE) test strip 1 each by Other route 3 (three) times daily. Use as instructed 07/09/22   Shamleffer, Ibtehal Jaralla, MD  insulin  aspart (NOVOLOG  FLEXPEN) 100 UNIT/ML FlexPen Max daily 30 units Patient taking differently: 30-60 Units See admin instructions. Per blood glucose reading. Max 60 units 07/09/22   Shamleffer, Ibtehal Jaralla, MD  Insulin  Degludec (TRESIBA ) 100 UNIT/ML  SOLN Inject 48 Units into the skin daily.    [provider]  insulin  glargine (LANTUS  SOLOSTAR) 100 UNIT/ML Solostar Pen Inject 28 Units into the skin daily. Patient not taking: Reported on 05/21/2023 07/13/22   Shamleffer, Ibtehal Jaralla, MD  Insulin  Pen Needle (BD PEN NEEDLE NANO U/F) 32G X 4 MM MISC BD PEN NEEDLES- BRAND SPECIFIC. INJECT INSULIN  VIA INSULIN  PEN 6 X DAILY 10/20/21   Ofelia Bent S, PA   Insulin  Pen Needle (PEN NEEDLES 3/16") 31G X 5 MM MISC 1 each by Does not apply route in the morning, at noon, in the evening, and at bedtime. 07/09/22   Shamleffer, Ibtehal Jaralla, MD  metFORMIN  (GLUCOPHAGE ) 1000 MG tablet Take 1 tablet (1,000 mg total) by mouth 2 (two) times daily. 07/09/22   Shamleffer, Ibtehal Jaralla, MD  metroNIDAZOLE  (FLAGYL ) 500 MG tablet Take 1 tablet (500 mg total) by mouth 2 (two) times daily. 05/21/23   Denese Finn, PA-C  naproxen sodium (ALEVE) 220 MG tablet Take 440 mg by mouth 2 (two) times daily as needed (pain). Patient not taking: Reported on 07/09/2022    [provider]  ondansetron  (ZOFRAN ) 4 MG tablet Take 1 tablet (4 mg total) by mouth every 6 (six) hours. Patient not taking: Reported on 05/21/2023 09/20/22   Spence Dux, PA-C  oxyCODONE -acetaminophen  (PERCOCET) 5-325 MG tablet Take 1-2 tablets by mouth every 6 (six) hours as needed. 07/14/23   Sherel Dikes, PA-C  senna-docusate (SENOKOT-S) 8.6-50 MG tablet Take 1 tablet by mouth at bedtime as needed for mild constipation. Patient not taking: Reported on 05/21/2023 08/28/22   Long, Shereen Dike, MD      Allergies    Patient has no known allergies.    Review of Systems   Review of Systems  All other systems reviewed and are negative.   Physical Exam Updated Vital Signs BP (!) 137/92   Pulse (!) 104   Temp 97.9 F (36.6 C) (Oral)   Resp 16   Ht 5\' 11"  (1.803 m)   Wt 97.5 kg   SpO2 100%   BMI 29.99 kg/m  Physical Exam Vitals and nursing note reviewed.  Constitutional:      Appearance: She is well-developed.  HENT:     Head: Normocephalic.  Cardiovascular:     Rate and Rhythm: Normal rate.  Pulmonary:     Effort: Pulmonary effort is normal.  Abdominal:     General: There is no distension.  Musculoskeletal:        General: Swelling and tenderness present.     Comments: Swollen tender knotted area right axilla  Skin:    General: Skin is warm.  Neurological:     General: No  focal deficit present.     Mental Status: She is alert and oriented to person, place, and time.     ED Results / Procedures / Treatments   Labs (all labs ordered are listed, but only abnormal results are displayed) Labs Reviewed - No data to display  EKG None  Radiology No results found.  Procedures .Incision and Drainage  Date/Time: 11/14/2023 1:58 PM  Performed by: Sandi Crosby, PA-C Authorized by: Sandi Crosby, PA-C   Consent:    Consent obtained:  Verbal   Consent given by:  Patient   Risks, benefits, and alternatives were discussed: yes     Risks discussed:  Incomplete drainage   Alternatives discussed:  No treatment Universal protocol:    Procedure explained and questions answered to patient or  proxy's satisfaction: yes     Immediately prior to procedure, a time out was called: yes     Patient identity confirmed:  Verbally with patient Location:    Type:  Abscess   Size:  10 Pre-procedure details:    Skin preparation:  Povidone-iodine  Anesthesia:    Anesthesia method:  Local infiltration Procedure type:    Complexity:  Complex Procedure details:    Ultrasound guidance: yes     Incision types:  Single straight   Wound management:  Probed and deloculated, irrigated with saline and extensive cleaning   Drainage:  Purulent   Drainage amount:  Copious   Packing materials:  1/4 in iodoform gauze Post-procedure details:    Procedure completion:  Tolerated well, no immediate complications     Medications Ordered in ED Medications  lidocaine  (PF) (XYLOCAINE ) 1 % injection 10 mL (10 mLs Infiltration Given 11/14/23 1330)  oxyCODONE -acetaminophen  (PERCOCET/ROXICET) 5-325 MG per tablet 1 tablet (1 tablet Oral Given 11/14/23 1232)    ED Course/ Medical Decision Making/ A&P                                 Medical Decision Making Patient has a large axilla abscess on the right  Risk Prescription drug management. Risk Details: I&D performed.  There was a  copious amount of drainage.  I&D was performed after ultrasound evaluation.  Large hard abscess. Patient advised t return if any problems she is advised return if any problems she should continue both antibiotics patient is given a prescription for hydrocodone  for pain           Final Clinical Impression(s) / ED Diagnoses Final diagnoses:  Abscess of right axilla    Rx / DC Orders ED Discharge Orders          Ordered    HYDROcodone -acetaminophen  (NORCO/VICODIN) 5-325 MG tablet  Every 4 hours PRN        11/14/23 1336              Estela Vinal K, PA-C 11/14/23 1400    Carin Charleston, MD 11/15/23 1535

## 2023-11-14 NOTE — Discharge Instructions (Addendum)
 Continue antibiotics.  Pull packing out in 2 days.  Return if any problems.

## 2023-11-14 NOTE — ED Triage Notes (Signed)
 PT arrived POV from home c/o an abscess under her right arm that was drained 2 days ago and she was started on PO antibiotics but pt states it is worse today and more painful.

## 2023-12-10 ENCOUNTER — Emergency Department (HOSPITAL_COMMUNITY)
Admission: EM | Admit: 2023-12-10 | Discharge: 2023-12-10 | Disposition: A | Discharge status: Home / Self Care | Attending: Emergency Medicine | Admitting: Emergency Medicine

## 2023-12-10 ENCOUNTER — Other Ambulatory Visit: Payer: Self-pay

## 2023-12-10 ENCOUNTER — Encounter (HOSPITAL_COMMUNITY): Payer: Self-pay

## 2023-12-10 ENCOUNTER — Emergency Department (HOSPITAL_COMMUNITY)

## 2023-12-10 DIAGNOSIS — S92503A Displaced unspecified fracture of unspecified lesser toe(s), initial encounter for closed fracture: Secondary | ICD-10-CM

## 2023-12-10 DIAGNOSIS — Z794 Long term (current) use of insulin: Secondary | ICD-10-CM | POA: Diagnosis not present

## 2023-12-10 DIAGNOSIS — E119 Type 2 diabetes mellitus without complications: Secondary | ICD-10-CM | POA: Diagnosis not present

## 2023-12-10 DIAGNOSIS — S92512A Displaced fracture of proximal phalanx of left lesser toe(s), initial encounter for closed fracture: Secondary | ICD-10-CM | POA: Diagnosis not present

## 2023-12-10 DIAGNOSIS — W228XXA Striking against or struck by other objects, initial encounter: Secondary | ICD-10-CM | POA: Insufficient documentation

## 2023-12-10 DIAGNOSIS — M79675 Pain in left toe(s): Secondary | ICD-10-CM | POA: Diagnosis present

## 2023-12-10 DIAGNOSIS — Z7984 Long term (current) use of oral hypoglycemic drugs: Secondary | ICD-10-CM | POA: Insufficient documentation

## 2023-12-10 MED ORDER — ACETAMINOPHEN 325 MG PO TABS
650.0000 mg | ORAL_TABLET | Freq: Four times a day (QID) | ORAL | Status: AC | PRN
  Administered 2023-12-10: 650 mg via ORAL
  Filled 2023-12-10: qty 2

## 2023-12-10 NOTE — ED Triage Notes (Signed)
 Pt is coming from home with left 4th toe pain that happened after the stubbed her toe on a large speaker at home, the toe does appear to be crooked upon assessment. She has no other complaints at this time and is otherwise stable at this time.

## 2023-12-10 NOTE — ED Notes (Signed)
Awaiting patient placement in room.

## 2023-12-10 NOTE — ED Notes (Signed)
 Patient discharged in stable condition, education materials explained including, follow up, any prescriptions and reasons to return. Patient voiced agreement to education and discharge material.

## 2023-12-10 NOTE — Discharge Instructions (Addendum)
 You were evaluated in the Emergency Department and after careful evaluation, we did not find any emergent condition requiring admission or further testing in the hospital.  Your exam/testing today is overall reassuring.  Your x-ray shows that you broke your toe.  Continue Tylenol  1000 mg every 4-6 hours for pain.  This should heal using the buddy tape and rest.  Recommend follow-up with orthopedics.  Please return to the Emergency Department if you experience any worsening of your condition.   Thank you for allowing us  to be a part of your care.

## 2023-12-10 NOTE — ED Provider Notes (Signed)
 [facility] Emergency Department Provider Note MRN:  [id number]  Arrival date & time: 12/10/23     Chief Complaint   Toe Pain   History of Present Illness   Danielle Norman is a 23 y.o. year-old female with a history of diabetes presenting to the ED with chief complaint of toe pain.  Stubbed her toe on the stereo, it feels crooked and painful.  No other injuries.  Review of Systems  A thorough review of systems was obtained and all systems are negative except as noted in the HPI and PMH.   Patient's Health History    Past Medical History:  Diagnosis Date   Anxiety    Diabetes mellitus, type 2 (HCC)    Diagnosed 10/2014, hbA1c 14.1% at diagnosis.  Started on insulin  and metformin    Heart murmur     Past Surgical History:  Procedure Laterality Date   ABCESS DRAINAGE     buttucks   EYE MUSCLE SURGERY     I & D EXTREMITY Left 12/21/2021   Procedure: IRRIGATION AND DEBRIDEMENT, KNEE;  Surgeon: Fidel Rogue, MD;  Location: [facility] OR;  Service: Orthopedics;  Laterality: Left;    Family History  Problem Relation Age of Onset   Kidney disease Father    Diabetes Father        Reported as type 1 diabetes   Diabetes Maternal Aunt        type 2 diabetes, was on oral meds, now diet controlled   Thyroid  disease Maternal Grandmother     Social History   Socioeconomic History   Marital status: Single    Spouse name: Not on file   Number of children: Not on file   Years of education: Not on file   Highest education level: Not on file  Occupational History   Not on file  Tobacco Use   Smoking status: Never    Passive exposure: Yes   Smokeless tobacco: Never  Vaping Use   Vaping status: Former   Start date: 04/06/2015   Quit date: 12/04/2017  Substance and Sexual Activity   Alcohol use: No   Drug use: Yes    Types: Marijuana    Comment: pt states she smokes once a month   Sexual activity: Yes    Birth control/protection: Implant    Comment:  Nexplanon   Other Topics Concern   Not on file  Social History Narrative   [employer]   Social Drivers of Health   Financial Resource Strain: Not on File (11/05/2021)   Received from [employer]    Financial Resource Strain: 0  Food Insecurity: Not on File (03/11/2023)   Received from [facility] Insecurity    Food: 0  Transportation Needs: Not on File (11/05/2021)   Received from [employer] Needs    Transportation: 0  Physical Activity: Not on File (11/05/2021)   Received from [facility]   Physical Activity    Physical Activity: 0  Stress: Not on File (11/05/2021)   Received from [facility]   Stress    Stress: 0  Social Connections: Not on File (02/27/2023)   Received from [facility]   Social Connections    Connectedness: 0  Intimate Partner Violence: Not on file     Physical Exam   Vitals:   12/10/23 0041 12/10/23 0405  BP: 138/75 126/82  Pulse: 86 80  Resp: 16 18  Temp: 98.2 F (36.8 C) 98.1 F (36.7 C)  SpO2: 100% 100%    CONSTITUTIONAL: Well-appearing, NAD NEURO/PSYCH:  Alert and oriented x 3, no focal deficits EYES:  eyes equal and reactive ENT/NECK:  no LAD, no JVD CARDIO: Regular rate, well-perfused, normal S1 and S2 PULM:  CTAB no wheezing or rhonchi GI/GU:  non-distended, non-tender MSK/SPINE:  No gross deformities, no edema SKIN:  no rash, atraumatic   *Additional and/or pertinent findings included in MDM below  Diagnostic and Interventional Summary    EKG Interpretation Date/Time:    Ventricular Rate:    PR Interval:    QRS Duration:    QT Interval:    QTC Calculation:   R Axis:      Text Interpretation:         Labs Reviewed - No data to display  DG Toe 4th Left  Final Result      Medications  acetaminophen  (TYLENOL ) tablet 650 mg (650 mg Oral Given 12/10/23 0044)     Procedures  /  Critical Care Procedures  ED Course and Medical Decision Making  Initial Impression and Ddx Lateral deformity to the  left fourth toe, fracture confirmed on x-ray.  Neurovascularly intact.  Should become well aligned with buddy taping to the third toe.  Past medical/surgical history that increases complexity of ED encounter: None  Interpretation of Diagnostics I personally reviewed the toe x-ray and my interpretation is as follows: Fracture    Patient Reassessment and Ultimate Disposition/Management     Discharge  Patient management required discussion with the following services or consulting groups:  None  Complexity of Problems Addressed Acute complicated illness or Injury  Additional Data Reviewed and Analyzed Further history obtained from: None  Additional Factors Impacting ED Encounter Risk Prescriptions  Ozell HERO. Theadore, MD [facility] Emergency Medicine [facility] [email or web address]  Final Clinical Impressions(s) / ED Diagnoses     ICD-10-CM   1. Closed displaced fracture of phalanx of lesser toe, unspecified laterality, unspecified phalanx, initial encounter  S92.503A       ED Discharge Orders          Ordered    Post-op shoe  Status:  Canceled        12/10/23 9360             Discharge Instructions Discussed with and Provided to Patient:    Discharge Instructions      You were evaluated in the Emergency Department and after careful evaluation, we did not find any emergent condition requiring admission or further testing in the hospital.  Your exam/testing today is overall reassuring.  Your x-ray shows that you broke your toe.  Continue Tylenol  1000 mg every 4-6 hours for pain.  This should heal using the buddy tape and rest.  Recommend follow-up with orthopedics.  Please return to the Emergency Department if you experience any worsening of your condition.   Thank you for allowing us  to be a part of your care.      Theadore Ozell HERO, MD 12/10/23 [phone]

## 2024-01-15 ENCOUNTER — Encounter (HOSPITAL_COMMUNITY): Payer: Self-pay

## 2024-01-15 ENCOUNTER — Emergency Department (HOSPITAL_COMMUNITY)
Admission: EM | Admit: 2024-01-15 | Discharge: 2024-01-15 | Disposition: A | Discharge status: Home / Self Care | Attending: Emergency Medicine | Admitting: Emergency Medicine

## 2024-01-15 ENCOUNTER — Other Ambulatory Visit: Payer: Self-pay

## 2024-01-15 DIAGNOSIS — Z794 Long term (current) use of insulin: Secondary | ICD-10-CM | POA: Diagnosis not present

## 2024-01-15 DIAGNOSIS — E119 Type 2 diabetes mellitus without complications: Secondary | ICD-10-CM | POA: Diagnosis not present

## 2024-01-15 DIAGNOSIS — Z7984 Long term (current) use of oral hypoglycemic drugs: Secondary | ICD-10-CM | POA: Diagnosis not present

## 2024-01-15 DIAGNOSIS — L02411 Cutaneous abscess of right axilla: Secondary | ICD-10-CM | POA: Insufficient documentation

## 2024-01-15 MED ORDER — OXYCODONE-ACETAMINOPHEN 5-325 MG PO TABS
ORAL_TABLET | ORAL | Status: AC
  Filled 2024-01-15: qty 1

## 2024-01-15 MED ORDER — LIDOCAINE-EPINEPHRINE-TETRACAINE (LET) TOPICAL GEL
3.0000 mL | Freq: Once | TOPICAL | Status: AC
  Administered 2024-01-15: 3 mL via TOPICAL
  Filled 2024-01-15: qty 3

## 2024-01-15 MED ORDER — HYDROCODONE-ACETAMINOPHEN 5-325 MG PO TABS: 1.0000 | ORAL_TABLET | Freq: Four times a day (QID) | ORAL | 0 refills | Status: AC | PRN

## 2024-01-15 MED ORDER — LIDOCAINE-EPINEPHRINE (PF) 2 %-1:200000 IJ SOLN
10.0000 mL | Freq: Once | INTRAMUSCULAR | Status: AC
  Administered 2024-01-15: 10 mL
  Filled 2024-01-15: qty 20

## 2024-01-15 MED ORDER — OXYCODONE-ACETAMINOPHEN 5-325 MG PO TABS
1.0000 | ORAL_TABLET | ORAL | Status: AC | PRN
  Administered 2024-01-15 (×2): 1 via ORAL
  Filled 2024-01-15: qty 1

## 2024-01-15 MED ORDER — DOXYCYCLINE HYCLATE 100 MG PO CAPS: 100.0000 mg | ORAL_CAPSULE | Freq: Two times a day (BID) | ORAL | 0 refills | Status: AC

## 2024-01-15 NOTE — ED Triage Notes (Signed)
 Pt c.o abscess under her right arm, hx of same. No drainage noted.

## 2024-01-15 NOTE — Discharge Instructions (Addendum)
 I would recommend following up with dermatology.  Take doxycycline  twice a day as prescribed.  Remove packing in 2 to 3 days if this has not fallen out.  You can return to emergency room for wound check or new or worsening symptoms.

## 2024-01-15 NOTE — ED Notes (Signed)
I&D tray is at bedside

## 2024-01-15 NOTE — ED Provider Notes (Signed)
 [facility] AT [facility] Provider Note   CSN: [id number] Arrival date & time: 01/15/24  9157     Patient presents with: Abscess   Danielle Norman is a 23 y.o. female.  Patient with past medical history of diabetes, hidradenitis suppurativa presents to emergency room with complaint of abscess.  Patient reports that she has an abscess under her right armpit that has been present for about 2 or 3 days.  She reports that she frequently needs to have them drained in the emergency room.  Notes significant pain but denies fever chills or other symptoms.    Abscess      Prior to Admission medications   Medication Sig Start Date End Date Taking? Authorizing Provider  Accu-Chek FastClix Lancets MISC Check sugar 6 x daily 10/21/18   Verdon Darnel, NP  acetone, urine, test strip Check ketones per protocol Patient not taking: Reported on 07/09/2022 11/01/14   Fabian Carola Lapine, MD  blood glucose meter kit and supplies Dispense based on patient and insurance preference. Use up to four times daily as directed. (FOR ICD-10 E10.9, E11.9). 10/20/21   Neldon Hamp GORMAN, PA  Blood Glucose Monitoring Suppl (ACCU-CHEK GUIDE ME) w/Device KIT 1 Device by Does not apply route 3 (three) times daily. 07/09/22   Shamleffer, Ibtehal Jaralla, MD  cephALEXin  (KEFLEX ) 500 MG capsule Take 1 capsule (500 mg total) by mouth 3 (three) times daily. 11/11/23   Ruthell Lonni FALCON, PA-C  dicyclomine  (BENTYL ) 20 MG tablet Take 1 tablet (20 mg total) by mouth 2 (two) times daily. Patient not taking: Reported on 05/21/2023 09/20/22   Bernis Ernst, PA-C  docusate sodium  (COLACE) 100 MG capsule Take 1 capsule (100 mg total) by mouth 2 (two) times daily. Patient not taking: Reported on 07/09/2022 12/27/21   Rojelio Nest, DO  doxycycline  (VIBRAMYCIN ) 100 MG capsule Take 1 capsule (100 mg total) by mouth 2 (two) times daily. 11/11/23   Ruthell Lonni FALCON, PA-C  Dulaglutide  (TRULICITY ) 0.75 MG/0.5ML  SOPN Inject 0.75 mg into the skin once a week. Patient not taking: Reported on 05/21/2023 07/09/22   Shamleffer, Ibtehal Jaralla, MD  emtricitabine -tenofovir  (TRUVADA ) 200-300 MG tablet Take 1 tablet by mouth daily. Patient not taking: Reported on 05/21/2023 12/25/21   Rojelio Nest, DO  glucagon  1 MG injection Inject 1 mg into the muscle once as needed for up to 1 dose. Use for Severe Hypoglycemia . Inject 1 mg intramuscularly if unresponsive, unable to swallow, unconscious and/or has seizure 07/09/22   Shamleffer, Donell Cardinal, MD  glucose blood (ACCU-CHEK GUIDE) test strip 1 each by Other route 3 (three) times daily. Use as instructed 07/09/22   Shamleffer, Ibtehal Jaralla, MD  HYDROcodone -acetaminophen  (NORCO/VICODIN) 5-325 MG tablet Take 1 tablet by mouth every 4 (four) hours as needed. 11/14/23 11/13/24  Sofia, Leslie K, PA-C  insulin  aspart (NOVOLOG  FLEXPEN) 100 UNIT/ML FlexPen Max daily 30 units Patient taking differently: 30-60 Units See admin instructions. Per blood glucose reading. Max 60 units 07/09/22   Shamleffer, Donell Cardinal, MD  Insulin  Degludec (TRESIBA ) 100 UNIT/ML SOLN Inject 48 Units into the skin daily.    [provider]  insulin  glargine (LANTUS  SOLOSTAR) 100 UNIT/ML Solostar Pen Inject 28 Units into the skin daily. Patient not taking: Reported on 05/21/2023 07/13/22   Shamleffer, Ibtehal Jaralla, MD  Insulin  Pen Needle (BD PEN NEEDLE NANO U/F) 32G X 4 MM MISC BD PEN NEEDLES- BRAND SPECIFIC. INJECT INSULIN  VIA INSULIN  PEN 6 X DAILY 10/20/21   Fondaw,  Wylder S, PA  Insulin  Pen Needle (PEN NEEDLES 3/16) 31G X 5 MM MISC 1 each by Does not apply route in the morning, at noon, in the evening, and at bedtime. 07/09/22   Shamleffer, Ibtehal Jaralla, MD  metFORMIN  (GLUCOPHAGE ) 1000 MG tablet Take 1 tablet (1,000 mg total) by mouth 2 (two) times daily. 07/09/22   Shamleffer, Ibtehal Jaralla, MD  metroNIDAZOLE  (FLAGYL ) 500 MG tablet Take 1 tablet (500 mg total) by mouth 2 (two) times  daily. 05/21/23   Victor Lynwood DASEN, PA-C  naproxen sodium (ALEVE) 220 MG tablet Take 440 mg by mouth 2 (two) times daily as needed (pain). Patient not taking: Reported on 07/09/2022    [provider]  ondansetron  (ZOFRAN ) 4 MG tablet Take 1 tablet (4 mg total) by mouth every 6 (six) hours. Patient not taking: Reported on 05/21/2023 09/20/22   Bernis Ernst, PA-C  oxyCODONE -acetaminophen  (PERCOCET) 5-325 MG tablet Take 1-2 tablets by mouth every 6 (six) hours as needed. 07/14/23   Vicky Charleston, PA-C  senna-docusate (SENOKOT-S) 8.6-50 MG tablet Take 1 tablet by mouth at bedtime as needed for mild constipation. Patient not taking: Reported on 05/21/2023 08/28/22   Long, Fonda MATSU, MD    Allergies: Patient has no known allergies.    Review of Systems  Skin:  Positive for wound.    Updated Vital Signs BP 112/76 (BP Location: Left Arm)   Pulse (!) 110   Temp 98.3 F (36.8 C)   Resp 16   SpO2 99%   Physical Exam Vitals and nursing note reviewed.  Constitutional:      General: She is not in acute distress.    Appearance: She is not toxic-appearing.  HENT:     Head: Normocephalic and atraumatic.  Eyes:     General: No scleral icterus.    Conjunctiva/sclera: Conjunctivae normal.  Cardiovascular:     Rate and Rhythm: Normal rate and regular rhythm.     Pulses: Normal pulses.     Heart sounds: Normal heart sounds.  Pulmonary:     Effort: Pulmonary effort is normal. No respiratory distress.     Breath sounds: Normal breath sounds.  Abdominal:     General: Abdomen is flat. Bowel sounds are normal.     Palpations: Abdomen is soft.     Tenderness: There is no abdominal tenderness.  Musculoskeletal:     Right lower leg: No edema.     Left lower leg: No edema.  Skin:    General: Skin is warm and dry.     Findings: No lesion.     Comments: Right armpit with 1 x 3 cm abscess and area of fluctuance with surrounding erythema.  Neurological:     General: No focal deficit present.      Mental Status: She is alert and oriented to person, place, and time. Mental status is at baseline.     (all labs ordered are listed, but only abnormal results are displayed) Labs Reviewed - No data to display  EKG: None  Radiology: No results found.   SABRAUltrasound ED Soft Tissue  Date/Time: 01/15/2024 9:50 AM  Performed by: Shermon Warren SAILOR, PA-C Authorized by: Shermon Warren SAILOR, PA-C   Procedure details:    Indications: localization of abscess     Transverse view:  Visualized   Longitudinal view:  Visualized Location:    Location: axilla     Side:  Right Findings:     abscess present .Incision and Drainage  Date/Time: 01/15/2024 11:48 AM  Performed by: Shermon Warren SAILOR, PA-C Authorized by: Shermon Warren SAILOR, PA-C   Consent:    Consent obtained:  Verbal   Consent given by:  Patient   Risks discussed:  Bleeding, damage to other organs, infection, incomplete drainage and pain   Alternatives discussed:  Alternative treatment, delayed treatment, no treatment, observation and referral Universal protocol:    Procedure explained and questions answered to patient or proxy's satisfaction: yes     Relevant documents present and verified: yes     Test results available : yes     Imaging studies available: yes     Required blood products, implants, devices, and special equipment available: yes     Site/side marked: yes     Immediately prior to procedure, a time out was called: yes     Patient identity confirmed:  Verbally with patient Location:    Type:  Abscess   Location:  Upper extremity Pre-procedure details:    Skin preparation:  Antiseptic wash Anesthesia:    Anesthesia method:  Topical application and local infiltration   Topical anesthetic:  LET   Local anesthetic:  Lidocaine  2% WITH epi Procedure type:    Complexity:  Simple Procedure details:    Ultrasound guidance: yes     Incision types:  Stab incision   Incision depth:  Dermal   Wound management:  Probed  and deloculated   Drainage:  Purulent   Drainage amount:  Moderate   Packing materials:  1/4 in gauze Post-procedure details:    Procedure completion:  Tolerated    Medications Ordered in the ED  oxyCODONE -acetaminophen  (PERCOCET/ROXICET) 5-325 MG per tablet 1 tablet (1 tablet Oral Given 01/15/24 0854)  lidocaine -EPINEPHrine -tetracaine  (LET) topical gel (has no administration in time range)  lidocaine -EPINEPHrine  (XYLOCAINE  W/EPI) 2 %-1:200000 (PF) injection 10 mL (has no administration in time range)                                    Medical Decision Making Risk Prescription drug management.   This patient presents to the ED for concern of abscess, this involves an extensive number of treatment options, and is a complaint that carries with it a high risk of complications and morbidity.  The differential diagnosis includes HA, abscess, cellulitis   Problem List / ED Course / Critical interventions / Medication management  Patient presents to emergency room of abscess right axilla has been there for 2 days.  She has history of similar typically requiring incision and drainage.  She denies any IV drug use.  She is diabetic, Does have history of reported HS and does not see dermatology.  No sign of systemic illness.  Incision and drainage performed here with improvement in symptoms.  Will discharge with antibiotics and have her follow-up with dermatology.  Patient expresses understanding agrees to plan. I ordered medication including Norco, LET, lidocaine .  Reevaluation of the patient after these medicines showed that the patient improved I have reviewed the patients home medicines and have made adjustments as needed   Plan F/u w/ PCP in 2-3d to ensure resolution of sx.  Patient was given return precautions. Patient stable for discharge at this time.  Patient educated on sx/dx and verbalized understanding of plan. Return to ER w/ new or worsening sx.       Final diagnoses:   Abscess of axilla, right    ED Discharge Orders  Ordered    doxycycline  (VIBRAMYCIN ) 100 MG capsule  2 times daily        01/15/24 0951               Beaumont Austad, Warren SAILOR, PA-C 01/15/24 1151    Melvenia Motto, MD 01/15/24 1258

## 2024-03-07 ENCOUNTER — Emergency Department (HOSPITAL_COMMUNITY)

## 2024-03-07 ENCOUNTER — Emergency Department (HOSPITAL_COMMUNITY)
Admission: EM | Admit: 2024-03-07 | Discharge: 2024-03-07 | Disposition: A | Discharge status: Home / Self Care | Attending: Emergency Medicine | Admitting: Emergency Medicine

## 2024-03-07 ENCOUNTER — Other Ambulatory Visit: Payer: Self-pay

## 2024-03-07 DIAGNOSIS — R000 Tachycardia, unspecified: Secondary | ICD-10-CM | POA: Insufficient documentation

## 2024-03-07 DIAGNOSIS — T07XXXA Unspecified multiple injuries, initial encounter: Secondary | ICD-10-CM

## 2024-03-07 DIAGNOSIS — S0990XA Unspecified injury of head, initial encounter: Secondary | ICD-10-CM | POA: Diagnosis present

## 2024-03-07 DIAGNOSIS — M549 Dorsalgia, unspecified: Secondary | ICD-10-CM | POA: Diagnosis not present

## 2024-03-07 DIAGNOSIS — T148XXA Other injury of unspecified body region, initial encounter: Secondary | ICD-10-CM | POA: Insufficient documentation

## 2024-03-07 DIAGNOSIS — R109 Unspecified abdominal pain: Secondary | ICD-10-CM | POA: Diagnosis not present

## 2024-03-07 DIAGNOSIS — R079 Chest pain, unspecified: Secondary | ICD-10-CM | POA: Insufficient documentation

## 2024-03-07 DIAGNOSIS — S01331A Puncture wound without foreign body of right ear, initial encounter: Secondary | ICD-10-CM | POA: Diagnosis not present

## 2024-03-07 LAB — COMPREHENSIVE METABOLIC PANEL WITH GFR
ALT: 32 U/L (ref 0–44)
AST: 19 U/L (ref 15–41)
Albumin: 3.6 g/dL (ref 3.5–5.0)
Alkaline Phosphatase: 76 U/L (ref 38–126)
Anion gap: 16 — ABNORMAL HIGH (ref 5–15)
BUN: 7 mg/dL (ref 6–20)
CO2: 18 mmol/L — ABNORMAL LOW (ref 22–32)
Calcium: 8.9 mg/dL (ref 8.9–10.3)
Chloride: 104 mmol/L (ref 98–111)
Creatinine, Ser: 0.67 mg/dL (ref 0.44–1.00)
GFR, Estimated: >60 mL/min (ref >60)
Glucose, Bld: 283 mg/dL — ABNORMAL HIGH (ref 70–99)
Potassium: 3.2 mmol/L — ABNORMAL LOW (ref 3.5–5.1)
Sodium: 138 mmol/L (ref 135–145)
Total Bilirubin: 0.5 mg/dL (ref 0.0–1.2)
Total Protein: 7.5 g/dL (ref 6.5–8.1)

## 2024-03-07 LAB — I-STAT VENOUS BLOOD GAS, ED
Acid-base deficit: 1 mmol/L (ref 0.0–2.0)
Bicarbonate: 23.1 mmol/L (ref 20.0–28.0)
Calcium, Ion: 1.13 mmol/L — ABNORMAL LOW (ref 1.15–1.40)
HCT: 31 % — ABNORMAL LOW (ref 36.0–46.0)
Hemoglobin: 10.5 g/dL — ABNORMAL LOW (ref 12.0–15.0)
O2 Saturation: 99 %
Potassium: 3.9 mmol/L (ref 3.5–5.1)
Sodium: 139 mmol/L (ref 135–145)
TCO2: 24 mmol/L (ref 22–32)
pCO2, Ven: 37.4 mmHg — ABNORMAL LOW (ref 44–60)
pH, Ven: 7.4 (ref 7.25–7.43)
pO2, Ven: 126 mmHg — ABNORMAL HIGH (ref 32–45)

## 2024-03-07 LAB — CBC WITH DIFFERENTIAL/PLATELET
Abs Immature Granulocytes: 0.02 K/uL (ref 0.00–0.07)
Basophils Absolute: 0 K/uL (ref 0.0–0.1)
Basophils Relative: 1 %
Eosinophils Absolute: 0 K/uL (ref 0.0–0.5)
Eosinophils Relative: 0 %
HCT: 35.2 % — ABNORMAL LOW (ref 36.0–46.0)
Hemoglobin: 11.5 g/dL — ABNORMAL LOW (ref 12.0–15.0)
Immature Granulocytes: 0 %
Lymphocytes Relative: 24 %
Lymphs Abs: 2 K/uL (ref 0.7–4.0)
MCH: 27.4 pg (ref 26.0–34.0)
MCHC: 32.7 g/dL (ref 30.0–36.0)
MCV: 84 fL (ref 80.0–100.0)
Monocytes Absolute: 0.4 K/uL (ref 0.1–1.0)
Monocytes Relative: 5 %
Neutro Abs: 5.9 K/uL (ref 1.7–7.7)
Neutrophils Relative %: 70 %
Platelets: 320 K/uL (ref 150–400)
RBC: 4.19 MIL/uL (ref 3.87–5.11)
RDW: 12.8 % (ref 11.5–15.5)
WBC: 8.4 K/uL (ref 4.0–10.5)
nRBC: 0 % (ref 0.0–0.2)

## 2024-03-07 LAB — BASIC METABOLIC PANEL WITH GFR
Anion gap: 11 (ref 5–15)
BUN: 6 mg/dL (ref 6–20)
CO2: 21 mmol/L — ABNORMAL LOW (ref 22–32)
Calcium: 8.5 mg/dL — ABNORMAL LOW (ref 8.9–10.3)
Chloride: 105 mmol/L (ref 98–111)
Creatinine, Ser: 0.57 mg/dL (ref 0.44–1.00)
GFR, Estimated: >60 mL/min (ref >60)
Glucose, Bld: 183 mg/dL — ABNORMAL HIGH (ref 70–99)
Potassium: 3.6 mmol/L (ref 3.5–5.1)
Sodium: 137 mmol/L (ref 135–145)

## 2024-03-07 LAB — BETA-HYDROXYBUTYRIC ACID: Beta-Hydroxybutyric Acid: 0.19 mmol/L (ref 0.05–0.27)

## 2024-03-07 LAB — I-STAT CG4 LACTIC ACID, ED
Lactic Acid, Venous: 2.5 mmol/L (ref 0.5–1.9)
Lactic Acid, Venous: 2 mmol/L (ref 0.5–1.9)

## 2024-03-07 LAB — LIPASE, BLOOD: Lipase: 11 U/L (ref 11–51)

## 2024-03-07 LAB — HCG, SERUM, QUALITATIVE: Preg, Serum: NEGATIVE

## 2024-03-07 LAB — ETHANOL: Alcohol, Ethyl (B): 75 mg/dL — ABNORMAL HIGH (ref <15)

## 2024-03-07 MED ORDER — FLUORESCEIN SODIUM 1 MG OP STRP
1.0000 | ORAL_STRIP | Freq: Once | OPHTHALMIC | Status: AC
  Administered 2024-03-07: 1 via OPHTHALMIC
  Filled 2024-03-07: qty 1

## 2024-03-07 MED ORDER — IOHEXOL 350 MG/ML SOLN
75.0000 mL | Freq: Once | INTRAVENOUS | Status: AC | PRN
  Administered 2024-03-07: 75 mL via INTRAVENOUS

## 2024-03-07 MED ORDER — LATANOPROST 0.005 % OP SOLN
1.0000 [drp] | Freq: Once | OPHTHALMIC | Status: DC
  Filled 2024-03-07: qty 2.5

## 2024-03-07 MED ORDER — TIMOLOL MALEATE 0.5 % OP SOLN
1.0000 [drp] | Freq: Once | OPHTHALMIC | Status: DC
  Filled 2024-03-07: qty 5

## 2024-03-07 MED ORDER — LACTATED RINGERS IV BOLUS
1000.0000 mL | Freq: Once | INTRAVENOUS | Status: AC
  Administered 2024-03-07: 1000 mL via INTRAVENOUS

## 2024-03-07 MED ORDER — TETRACAINE HCL 0.5 % OP SOLN
2.0000 [drp] | Freq: Once | OPHTHALMIC | Status: AC
Start: 2024-03-07 — End: 2024-03-07
  Administered 2024-03-07: 2 [drp] via OPHTHALMIC
  Filled 2024-03-07: qty 4

## 2024-03-07 MED ORDER — KETOROLAC TROMETHAMINE 15 MG/ML IJ SOLN
15.0000 mg | Freq: Once | INTRAMUSCULAR | Status: AC
  Administered 2024-03-07: 15 mg via INTRAVENOUS
  Filled 2024-03-07: qty 1

## 2024-03-07 MED ORDER — HYDROCODONE-ACETAMINOPHEN 5-325 MG PO TABS
1.0000 | ORAL_TABLET | Freq: Once | ORAL | Status: AC
  Administered 2024-03-07: 1 via ORAL
  Filled 2024-03-07: qty 1

## 2024-03-07 MED ORDER — AMOXICILLIN-POT CLAVULANATE 875-125 MG PO TABS: 1.0000 | ORAL_TABLET | Freq: Two times a day (BID) | ORAL | 0 refills | Status: AC

## 2024-03-07 MED ORDER — LACTATED RINGERS IV BOLUS
1000.0000 mL | Freq: Once | INTRAVENOUS | Status: AC
Start: 2024-03-07 — End: 2024-03-07
  Administered 2024-03-07: 1000 mL via INTRAVENOUS

## 2024-03-07 NOTE — ED Notes (Signed)
 Pt states she has dizziness and nausea

## 2024-03-07 NOTE — Discharge Instructions (Addendum)
 Take the antibiotics as prescribed.  Your CT scans are reassuring without evidence of fractures or serious injury.  Keep wound clean and dry.  Follow-up with the ophthalmologist for a recheck of your eyes today or tomorrow.  Return to the ED with new or worsening symptoms.
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
## 2024-03-10 ENCOUNTER — Emergency Department (HOSPITAL_COMMUNITY)

## 2024-03-10 ENCOUNTER — Encounter (HOSPITAL_COMMUNITY): Payer: Self-pay | Admitting: *Deleted

## 2024-03-10 ENCOUNTER — Other Ambulatory Visit: Payer: Self-pay

## 2024-03-10 ENCOUNTER — Emergency Department (HOSPITAL_COMMUNITY): Admission: EM | Admit: 2024-03-10 | Discharge: 2024-03-10 | Discharge status: Against Medical Advice

## 2024-03-10 DIAGNOSIS — Z5321 Procedure and treatment not carried out due to patient leaving prior to being seen by health care provider: Secondary | ICD-10-CM | POA: Diagnosis not present

## 2024-03-10 DIAGNOSIS — H579 Unspecified disorder of eye and adnexa: Secondary | ICD-10-CM | POA: Diagnosis present
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
## 2024-03-10 NOTE — ED Triage Notes (Signed)
 She was seen here 3 days ago for the same problems she's is here tonight for pain in her lt eye it is no better she also reports that the light hurts her eye  lmp 5 days ago

## 2024-03-10 NOTE — ED Notes (Signed)
 Pt called for vital reassessment x3 no response.
